# Patient Record
Sex: Male | Born: 1935 | Race: White | Hispanic: No | Smoking: Former smoker
Health system: Southern US, Community
[De-identification: ages and names within clinical notes are randomized; demographics above are authoritative.]

## PROBLEM LIST (undated history)

## (undated) DIAGNOSIS — M1990 Unspecified osteoarthritis, unspecified site: Secondary | ICD-10-CM

## (undated) DIAGNOSIS — I10 Essential (primary) hypertension: Secondary | ICD-10-CM

## (undated) DIAGNOSIS — C6290 Malignant neoplasm of unspecified testis, unspecified whether descended or undescended: Secondary | ICD-10-CM

## (undated) DIAGNOSIS — R2681 Unsteadiness on feet: Secondary | ICD-10-CM

## (undated) DIAGNOSIS — D649 Anemia, unspecified: Secondary | ICD-10-CM

## (undated) DIAGNOSIS — M069 Rheumatoid arthritis, unspecified: Secondary | ICD-10-CM

## (undated) DIAGNOSIS — R0602 Shortness of breath: Secondary | ICD-10-CM

## (undated) DIAGNOSIS — R002 Palpitations: Secondary | ICD-10-CM

## (undated) DIAGNOSIS — K219 Gastro-esophageal reflux disease without esophagitis: Secondary | ICD-10-CM

## (undated) DIAGNOSIS — H3530 Unspecified macular degeneration: Secondary | ICD-10-CM

## (undated) DIAGNOSIS — I2510 Atherosclerotic heart disease of native coronary artery without angina pectoris: Secondary | ICD-10-CM

## (undated) DIAGNOSIS — G20 Parkinson's disease: Secondary | ICD-10-CM

## (undated) DIAGNOSIS — R52 Pain, unspecified: Secondary | ICD-10-CM

## (undated) DIAGNOSIS — G20A1 Parkinson's disease without dyskinesia, without mention of fluctuations: Secondary | ICD-10-CM

## (undated) DIAGNOSIS — H9191 Unspecified hearing loss, right ear: Secondary | ICD-10-CM

## (undated) HISTORY — DX: Unsteadiness on feet: R26.81

## (undated) HISTORY — DX: Palpitations: R00.2

## (undated) HISTORY — PX: OTHER SURGICAL HISTORY: SHX169

## (undated) HISTORY — DX: Atherosclerotic heart disease of native coronary artery without angina pectoris: I25.10

## (undated) HISTORY — PX: APPENDECTOMY: SHX54

## (undated) HISTORY — DX: Unspecified hearing loss, right ear: H91.91

## (undated) HISTORY — DX: Unspecified osteoarthritis, unspecified site: M19.90

## (undated) HISTORY — DX: Parkinson's disease: G20

## (undated) HISTORY — DX: Malignant neoplasm of unspecified testis, unspecified whether descended or undescended: C62.90

## (undated) HISTORY — DX: Rheumatoid arthritis, unspecified: M06.9

## (undated) HISTORY — DX: Unspecified macular degeneration: H35.30

## (undated) HISTORY — DX: Anemia, unspecified: D64.9

## (undated) HISTORY — DX: Essential (primary) hypertension: I10

## (undated) HISTORY — DX: Parkinson's disease without dyskinesia, without mention of fluctuations: G20.A1

## (undated) HISTORY — PX: COLONOSCOPY: SHX174

## (undated) HISTORY — DX: Shortness of breath: R06.02

## (undated) HISTORY — DX: Gastro-esophageal reflux disease without esophagitis: K21.9

## (undated) HISTORY — PX: CARDIAC CATHETERIZATION: SHX172

## (undated) HISTORY — DX: Pain, unspecified: R52

---

## 1982-07-13 HISTORY — PX: OTHER SURGICAL HISTORY: SHX169

## 1994-07-13 HISTORY — PX: OTHER SURGICAL HISTORY: SHX169

## 2007-02-10 ENCOUNTER — Ambulatory Visit (HOSPITAL_COMMUNITY): Admission: RE | Admit: 2007-02-10 | Discharge: 2007-02-10 | Payer: Self-pay | Admitting: Internal Medicine

## 2007-03-31 ENCOUNTER — Encounter: Payer: Self-pay | Admitting: Cardiology

## 2007-04-06 ENCOUNTER — Encounter: Payer: Self-pay | Admitting: Cardiology

## 2007-06-10 ENCOUNTER — Encounter: Payer: Self-pay | Admitting: Cardiology

## 2009-09-09 ENCOUNTER — Encounter: Payer: Self-pay | Admitting: Cardiology

## 2009-10-18 ENCOUNTER — Ambulatory Visit: Payer: Self-pay | Admitting: Cardiology

## 2009-10-18 ENCOUNTER — Encounter: Payer: Self-pay | Admitting: Cardiology

## 2009-10-28 ENCOUNTER — Encounter: Payer: Self-pay | Admitting: Cardiology

## 2009-12-02 ENCOUNTER — Encounter: Payer: Self-pay | Admitting: Cardiology

## 2009-12-03 ENCOUNTER — Ambulatory Visit: Payer: Self-pay | Admitting: Cardiology

## 2009-12-03 DIAGNOSIS — Z8679 Personal history of other diseases of the circulatory system: Secondary | ICD-10-CM | POA: Insufficient documentation

## 2009-12-03 DIAGNOSIS — J4489 Other specified chronic obstructive pulmonary disease: Secondary | ICD-10-CM | POA: Insufficient documentation

## 2009-12-03 DIAGNOSIS — R9439 Abnormal result of other cardiovascular function study: Secondary | ICD-10-CM

## 2009-12-03 DIAGNOSIS — R079 Chest pain, unspecified: Secondary | ICD-10-CM

## 2009-12-03 DIAGNOSIS — R5383 Other fatigue: Secondary | ICD-10-CM

## 2009-12-03 DIAGNOSIS — R5381 Other malaise: Secondary | ICD-10-CM

## 2009-12-03 DIAGNOSIS — D649 Anemia, unspecified: Secondary | ICD-10-CM

## 2009-12-03 DIAGNOSIS — J449 Chronic obstructive pulmonary disease, unspecified: Secondary | ICD-10-CM

## 2009-12-03 DIAGNOSIS — I2510 Atherosclerotic heart disease of native coronary artery without angina pectoris: Secondary | ICD-10-CM | POA: Insufficient documentation

## 2009-12-10 ENCOUNTER — Encounter: Payer: Self-pay | Admitting: Physician Assistant

## 2009-12-31 ENCOUNTER — Encounter: Payer: Self-pay | Admitting: Cardiology

## 2010-01-16 ENCOUNTER — Ambulatory Visit: Payer: Self-pay | Admitting: Cardiology

## 2010-05-06 ENCOUNTER — Encounter: Payer: Self-pay | Admitting: Cardiology

## 2010-05-08 ENCOUNTER — Encounter: Payer: Self-pay | Admitting: Cardiology

## 2010-08-12 NOTE — Assessment & Plan Note (Signed)
Summary: np/ref: shah/ abnormal stress test-[facility]   Visit Type:  Initial Consult Primary Provider:  Dhruv Vyas,MD  CC:  chest pain.  History of Present Illness: The patient is seen for cardiac evaluation.  He is a pleasant active 77 year old male.  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  He has not had syncope or presyncope.  By history he says that he had an MI in 1981 that was treated at [facility].  We do not have any information.  Since that time he has had intermittent sharp chest discomfort.  It does not limit him.  A nuclear stress study was done October 18, 2009.  He walked for 3 minutes.  He had some ST changes with shortness of breath.  Ejection fraction was 54%.  There was no ischemia.  There was question of either inferior attenuation or inferior scar.  The patient also mentions that he has palpitations.  These are not prolonged. He has not had syncope or presyncope.  Preventive Screening-Counseling & Management  Alcohol-Tobacco     Smoking Status: quit     Year Quit: 1963  Current Medications (verified): 1)  Prilosec Otc 20 Mg Tbec (Omeprazole Magnesium) .... Take 1 Tablet By Mouth Once A Day As Needed 2)  Multivitamins  Tabs (Multiple Vitamin) .... Take 1 Tablet By Mouth Once A Day 3)  Potassium Gluconate 595 Mg Tabs (Potassium Gluconate) .... Take 1 Tablet By Mouth Once A Day 4)  Optic-Vites  Tabs (Multiple Vitamins-Minerals) .... Take 1 Tablet By Mouth Once A Day  Allergies (verified): No Known Drug Allergies  Comments:  Nurse/Medical Assistant: The patient's medications and allergies were reviewed with the patient and were updated in the Medication and Allergy Lists. Bottles reviewed.  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 Donovan Kail) Systemic arterial hypertension Chest pains Shortness of Breath Nuclear stress test.... October 18, 2009... treadmill 3 minutes... abnormal STs... short of breath... 54%.... no ischemia.... probable inferior attenuation.( can not r/o/ scar)  Social History: Smoking Status:  quit  Review of Systems       Patient denies fever, chills, headache, sweats, rash, change in vision, change in hearing, cough, nausea vomiting, urinary symptoms. All other systems are reviewed and are negative.  Vital Signs:  Patient profile:   77 year old male Height:      67 inches Weight:      162 pounds BMI:     25.46 Pulse rate:   69 / minute BP sitting:   154 / 82  (left arm) Cuff size:   regular  Vitals Entered By: Carlye Grippe (Dec 03, 2009 3:09 PM)  Nutrition Counseling: Patient's BMI is greater than 25 and therefore counseled on weight management options. CC: chest pain   Physical Exam  General:  The patient is quite stable. Head:  head is atraumatic. Eyes:  no xanthelasma. Neck:  no jugular venous distention.  No carotid bruits. Chest Wall:  no chest wall tenderness. Lungs:  lungs are clear.  Respiratory effort is nonlabored. Heart:  cardiac exam reveals S1 and S2.  No clicks or significant  murmurs. Abdomen:  abdomen is soft. Msk:  no musculoskeletal deformities. Extremities:  no peripheral edema Skin:  no skin rashes. Psych:  patient is oriented to person time and place.  Affect is normal.   Impression & Recommendations:  Problem # 1:  PALPITATIONS, HX OF (ICD-V12.50)  The patient has palpitations.  He has not had syncope or presyncope.  We will arrange 24-hour monitor to be sure that he is not having any significant arrhythmias.  Orders: Holter Monitor (Holter Monitor)  Problem # 2:   CAD (ICD-414.00) There is history of coronary disease.  We do not have the information. Recent EKG from the hospital is reviewed.  There no diagnostic abnormalities.  I did not repeat EKG today.  Problem # 3:  COPD (ICD-496) The patient does have mild COPD.  Pulmonary function studies were warranted 2008 showing mild obstruction.  There is question on one of his reports of the possibility of some asbestos exposure.  Problem # 4:  CHEST PAIN UNSPECIFIED (ICD-786.50) The patient does have chest pain.  I cannot absolutely rule out angina.  His nuclear exercise study did not show any marked ischemia.  He was limited by shortness of breath.  I discussed the issue with him and chosen not to proceed with cardiac catheterization.  However I will see him back for followup to assess him further over time.  Patient Instructions: 1)  FOLLOW UP APPOINTMENT WITH DR. Tamarius Rosenfield ON THURSDAY, January 16, 2010 AT 1PM.  2)  Your physician has recommended that you wear a holter monitor.  Holter monitors are medical devices that record the heart's electrical activity. Doctors most often use these monitors to diagnose arrhythmias. Arrhythmias are problems with the speed or rhythm of the heartbeat. The monitor is a small, portable device. You can wear one while you do your normal daily activities. This is usually used to diagnose what is causing palpitations/syncope (passing out). If the results of your test are normal or stable, you will receive a letter. If they are abnormal, the nurse will contact you by phone.  3)  Your physician recommends that you continue on your current medications as directed. Please refer to the Current Medication list given to you today.

## 2010-08-12 NOTE — Assessment & Plan Note (Signed)
Summary: 1 MONTH F/U PER 5/24 OV-JM   Visit Type:  Follow-up Primary Provider:  Dhruv Vyas,MD  CC:  palpitations.  History of Present Illness: The patient is seen for followup of palpitations.  I saw him last Dec 03, 2009.  I arranged for a 24-hour Holter monitor.  This was done.  Patient had normal sinus rhythm with scattered PVCs.  There were no significant arrhythmias.  Since that time he has had 3 episodes of feeling palpitations.  It always occurs at nighttime.  He has not had any during the day.  He is not having any significant chest pain.  Preventive Screening-Counseling & Management  Alcohol-Tobacco     Smoking Status: quit     Year Quit: 1963  Current Medications (verified): 1)  Prilosec Otc 20 Mg Tbec (Omeprazole Magnesium) .... Take 1 Tablet By Mouth Once A Day As Needed 2)  Multivitamins  Tabs (Multiple Vitamin) .... Take 1 Tablet By Mouth Once A Day 3)  Potassium Gluconate 595 Mg Tabs (Potassium Gluconate) .... Take 1 Tablet By Mouth Once A Day 4)  Optic-Vites  Tabs (Multiple Vitamins-Minerals) .... Take 1 Tablet By Mouth Once A Day  Allergies (verified): No Known Drug Allergies  Comments:  Nurse/Medical Assistant: The patient's medications and allergies were verbally reviewed with the patient and were updated in the Medication and Allergy Lists.  Past History:  Past Medical History: Mild Anemia Coronary artery atherosclerotic heart disease post MI in 1981 (treated at [facility] by Dr. Donovan Kail) Systemic arterial hypertension Chest pains Shortness of Breath Nuclear stress test.... October 18, 2009... treadmill 3 minutes... abnormal STs... short of breath... 54%.... no ischemia.... probable inferior attenuation.( can not r/o/ scar) palpitations.... Holter monitor.. May, 2011... normal sinus rhythm with PVCs  Review of Systems       Patient denies fever, chills, headache, sweats, rash, change in vision, change in hearing, chest pain, cough, nausea vomiting,  urinary symptoms.  He's not sleeping well.  All of the systems are reviewed and are negative.  Vital Signs:  Patient profile:   77 year old male Height:      67 inches Weight:      174 pounds Pulse rate:   77 / minute BP sitting:   149 / 89  (left arm) Cuff size:   regular  Vitals Entered By: Carlye Grippe (January 16, 2010 1:04 PM)  Physical Exam  General:  patient is stable. Eyes:  no xanthelasma. Neck:  no jugular venous distention. Lungs:  lungs are clear.  Respiratory effort is nonlabored. Heart:  cardiac exam reveals S1 and S2.  No clicks or significant murmurs Abdomen:  abdomen is soft. Extremities:  no peripheral edema. Psych:  patient is oriented to person time and place.  Affect is normal.   Impression & Recommendations:  Problem # 1:  PALPITATIONS, HX OF (ICD-V12.50) As outlined in the history of present illness the patient has some palpitations only at nighttime.  I decided not to push for other recording at this time.  He may need a 21 day recorder in the future.  We will try a small dose of beta blocker in the evening and see how he does.  Problem # 2:  CAD (ICD-414.00) Coronary disease is stable.  No further workup at this time.  Appended Document: [facility]     Allergies: No Known Drug Allergies   Patient Instructions: 1)  Follow up with Dr. Myrtis Ser on Friday, March 21, 2010 at 1:45pm. 2)  Start Metoprolol  tart (Lopressor) 25mg  by mouth every evening. Prescriptions: METOPROLOL TARTRATE 25 MG TABS (METOPROLOL TARTRATE) Take one tablet by mouth every evening  #30 x 6   Entered by:   Cyril Loosen, RN, BSN   Authorized by:   Talitha Givens, MD, [facility]   Signed by:   Cyril Loosen, RN, BSN on 01/16/2010   Method used:   Electronically to        [facility]* (retail)       [address]       [place], [place]  [address]       Ph: [phone]       Fax: [phone]   RxID:   [id number]   Handout requested.

## 2010-08-12 NOTE — Procedures (Signed)
Summary: Holter Monitor  Holter Monitor   Imported By: Cyril Loosen, RN, BSN 12/11/2009 14:22:23  _____________________________________________________________________  External Attachment:    Type:   Image     Comment:   External Document  Appended Document: Holter Monitor Left message to notify pt of results.

## 2010-08-12 NOTE — Miscellaneous (Signed)
  Clinical Lists Changes  Observations: Added new observation of CARDIO HPI: Nuclear stress 10/18/2009.Marland KitchenMarland KitchenMarland KitchenTreadmill  3 min...abnormal ST's...SOB...artifact...54%...no ischemia..probable inferior attenuation..(can't r/o/ scar) (12/02/2009 11:28)      History of Present Illness: Nuclear stress 10/18/2009.Marland KitchenMarland KitchenMarland KitchenTreadmill  3 min...abnormal ST's...SOB...artifact...54%...no ischemia..probable inferior attenuation..(can't r/o/ scar)

## 2010-09-12 ENCOUNTER — Telehealth: Payer: Self-pay | Admitting: Cardiology

## 2010-09-12 ENCOUNTER — Encounter: Payer: Self-pay | Admitting: Cardiology

## 2010-09-15 ENCOUNTER — Ambulatory Visit (INDEPENDENT_AMBULATORY_CARE_PROVIDER_SITE_OTHER): Payer: MEDICARE | Admitting: Cardiology

## 2010-09-15 ENCOUNTER — Encounter: Payer: Self-pay | Admitting: Cardiology

## 2010-09-15 ENCOUNTER — Encounter (INDEPENDENT_AMBULATORY_CARE_PROVIDER_SITE_OTHER): Payer: Self-pay | Admitting: *Deleted

## 2010-09-15 DIAGNOSIS — I10 Essential (primary) hypertension: Secondary | ICD-10-CM

## 2010-09-15 DIAGNOSIS — I2510 Atherosclerotic heart disease of native coronary artery without angina pectoris: Secondary | ICD-10-CM

## 2010-09-18 ENCOUNTER — Inpatient Hospital Stay (HOSPITAL_BASED_OUTPATIENT_CLINIC_OR_DEPARTMENT_OTHER)
Admission: RE | Admit: 2010-09-18 | Discharge: 2010-09-18 | Disposition: A | Discharge status: Home / Self Care | Payer: MEDICARE | Source: Ambulatory Visit | Attending: Cardiology | Admitting: Cardiology

## 2010-09-18 DIAGNOSIS — R079 Chest pain, unspecified: Secondary | ICD-10-CM | POA: Insufficient documentation

## 2010-09-18 DIAGNOSIS — I2510 Atherosclerotic heart disease of native coronary artery without angina pectoris: Secondary | ICD-10-CM

## 2010-09-18 NOTE — Letter (Signed)
Summary: External Correspondence/  [facility]  External Correspondence/  [facility]   Imported By: Dorise Hiss 09/12/2010 14:36:34  _____________________________________________________________________  External Attachment:    Type:   Image     Comment:   External Document

## 2010-09-18 NOTE — Progress Notes (Signed)
Summary: Needs ASAP appt Per Dr. Sherril Croon.  Phone Note From Other Clinic Call back at [phone]   Caller: Dennie Bible Summary of Call: Dennie Bible with [facility] called regarding pt. She states Dr. Sherril Croon would like pt seen next week for evaluation and possible cath. She states he was seen in the office for chest pain. She will send office note and EKG to our office today. She is aware a note will be sent to Dr. Myrtis Ser for further recommendation as his first available appt is in April. Initial call taken by: Cyril Loosen, RN, BSN,  September 12, 2010 1:45 PM  Follow-up for Phone Call        Dr. Myrtis Ser had cancellation for Monday, March 5th at 9:15am. Pt was scheduled in this spot and [facility] was notified by Judeth Cornfield.  Follow-up by: Cyril Loosen, RN, BSN,  September 12, 2010 2:36 PM

## 2010-09-23 ENCOUNTER — Encounter: Payer: Self-pay | Admitting: *Deleted

## 2010-09-23 NOTE — Letter (Signed)
Summary: Cardiac Cath Instructions - [facility]  [facility] [address]   [place], [place] [address]   Phone: [phone]  Fax: [phone]     09/15/2010 MRN: [id number]  [place] Pultz [address] [place], [place]  [address]  [place]  Dear Mr. Savoca,   You are scheduled for a Cardiac Catheterization on Thursday, September 18, 2010 with Dr. Antoine Poche.  Please arrive to the 1st floor of the Heart and Vascular Center at [facility] at 9:30 am on the day of your procedure. Please do not arrive before 6:30 a.m. Call the Heart and Vascular Center at [phone] if you are unable to make your appointmnet. The Code to get into the parking garage under the building is 3000. Take the elevators to the 1st floor. You must have someone to drive you home. Someone must be with you for the first 24 hours after you arrive home. Please wear clothes that are easy to get on and off and wear slip-on shoes. Do not eat or drink after midnight except water with your medications that morning. Bring all your medications and current insurance cards with you.  ___ DO NOT take these medications before your procedure:  _X__ Make sure you take your aspirin.  _X__ You may take all of your medications with water that morning.  ___ DO NOT take ANY medications before your procedure.  ___ Pre-med instructions:  ________________________________________________________________________  The usual length of stay after your procedure is 2 to 3 hours. This can vary.  If you have any questions, please call the office at the number listed above.  Cyril Loosen, RN, BSN              Directions to the [facility]  Please Note : Park in [place] under the building not the parking deck.  From [employer]: Turn onto [address] Left onto [address] (1st stoplight) Right at the brick entrance to the hospital (Main circle  drive) Bear to the right and you will see a blue sign "Heart and Vascular Center" Parking garage is a sharp right'to get through the gate out in the code _3000______. Once you park, take the elevator to the first floor. Please do not arrive before 0630am. The building will be dark before that time.   From [address] Turn onto [address] Turn left into the brick entrance to the hospital (Main circle drive) [place] to the right and you will see a blue sign "[facility] Center" Parking garage is a sharp right, to get thru the gate put in the code _3000___. Once you park, take the elevator to the first floor. Please do not arrive before 0630am. The building will be dark before that time

## 2010-09-23 NOTE — Assessment & Plan Note (Signed)
Summary: Possible Cath - Chest Pain  - [facility]   Visit Type:  Add-on for chest pain Primary Provider:  Dhruv Vyas,MD  CC:  CAD.  History of Present Illness: Clifford [name] is seen today as an add-on for evaluation of chest pain.  I saw him last July, 2011.  He has known coronary artery disease with an MI in 22.  This was evaluated elsewhere.  He had a stress nuclear scan in April, 2011.  At that time he had shortness of breath and some ST changes but no definite evidence of ischemia.  Last week he had some anterior chest discomfort that went through Clifford shoulder.  He had persistent left arm discomfort.  He also has had some pain in Clifford right side of his neck.  He saw Dr.Vyas who wanted him in Clifford hospital.  Clifford [name] did not want to be in Clifford hospital and agreed to start taking Imdur.  Arrangement was made for me to see him in Clifford office today.  He has not had any significant recurrent chest pain, but he describes some ongoing tightness that can be with him for several hours.  EKG done with his primary physician did not show any acute changes.  He has not had any nausea vomiting or diaphoresis.  He has had some mild shortness of breath. add  Preventive Screening-Counseling & Management  Alcohol-Tobacco     Smoking Status: quit     Year Quit: 1963  Current Medications (verified): 1)  Omeprazole 40 Mg Cpdr (Omeprazole) .... Take 1 Tablet By Mouth Once A Day 2)  Multivitamins  Tabs (Multiple Vitamin) .... Take 1 Tablet By Mouth Once A Day 3)  Potassium Gluconate 595 Mg Tabs (Potassium Gluconate) .... Take 1 Tablet By Mouth Once A Day 4)  Optic-Vites  Tabs (Multiple Vitamins-Minerals) .... Take 1 Tablet By Mouth Once A Day 5)  Nitrostat 0.4 Mg Subl (Nitroglycerin) .Marland Kitchen.. 1 Under Tongue @ Onset of Chest Pain,you May Repeat Every 5 Min. For Up To 3 Doses. If Chest Pain Continues @3rd  Dose,call 911 & Proceed To Er 6)  Meloxicam 7.5 Mg Tabs (Meloxicam) .... Take 1 Tablet By Mouth Two Times A Day As  Needed 7)  Isosorbide Mononitrate Cr 30 Mg Xr24h-Tab (Isosorbide Mononitrate) .... Take 1 Tablet By Mouth Once A Day  Allergies (verified): No Known Drug Allergies  Comments:  Nurse/Medical Assistant: Clifford [name]'s medication bottles and allergies were reviewed with Clifford [name] and were updated in Clifford Medication and Allergy Lists.  Past History:  Past Medical History: Anemia   mild CAD      post MI in 1981 (treated at [facility] by Dr. Donovan Kail)  /  nuclear stress... April, 2011... no ischemia  /   recurrent chest discomfort March, 2011 Hypertension Shortness of Breath Nuclear stress test.... October 18, 2009... treadmill 3 minutes... abnormal STs... short of breath... 54%.... no ischemia.... probable inferior attenuation.( can not r/o/ scar) palpitations.... Holter monitor.. May, 2011... normal sinus rhythm with PVCs  Review of Systems       [name] denies fever, chills, headache, sweats, rash, change in vision, change in hearing, nausea vomiting, urinary symptoms.  All other systems are reviewed and are negative.  Vital Signs:  [name] profile:   77 year old male Height:      67 inches Weight:      169 pounds BMI:     26.56 Pulse rate:   76 / minute BP sitting:   132 / 69  (left arm)  Cuff size:   regular  Vitals Entered By: Carlye Grippe (September 15, 2010 9:06 AM)  Nutrition Counseling: [name]'s BMI is greater than 25 and therefore counseled on weight management options. CC: CAD   Physical Exam  General:  Clifford [name] is stable today. Head:  head is atraumatic. Eyes:  no xanthelasma. Neck:  no jugular venous extension. Chest Wall:  no chest wall tenderness. Lungs:  lungs reveal a few scattered rhonchi.  There is no respiratory distress. Heart:  cardiac exam reveals S1 and S2.  There no clicks or significant murmurs. Abdomen:  abdomen is soft. Msk:  no musculoskeletal deformities. Extremities:  no peripheral edema. Skin:  no skin rashes. Psych:  [name] is  oriented to person time and place.  Affect is normal.  He is here with a friend today.   Impression & Recommendations:  Problem # 1:  PALPITATIONS, HX OF (ICD-V12.50) Clifford [name] has not had any significant palpitations.  No further workup.  Problem # 2:  COPD (ICD-496) His COPD he does not appear to be a significant problem at this time.  Problem # 3:  ANEMIA, MILD (ICD-285.9) There is a history of anemia.  Hemoglobin will be checked.  Problem # 4:  CAD (ICD-414.00)  Clifford [name] has known coronary artery disease.  There is history of an MI in 1981 treated elsewhere in Clifford past.  He has had return of chest discomfort which is concerning.  He has had intermittent pain since last week.  His EKGs have not shown any changes.  He feels better with Clifford addition of Imdur.  I had long discussion with Clifford [name] about Clifford approach to his care.  I told him that catheterization would be Clifford best way to obtain further information.  He was hesitant at first and we discussed fully all of Clifford options.  He then decided that he would like to proceed with catheterization if it can be done as an outpatient.  This can be arranged.  We will check his hemoglobin checked CPK and troponins.  Renal function of course will be checked.  We will arrange for outpatient catheterization.   Orders: Cardiac Catheterization (Cardiac Cath)  Problem # 5:  HYPERTENSION (ICD-401.9) Blood pressure control today.  No change in therapy.  Other Orders: EKG w/ Interpretation (93000) T-Chest x-ray, 2 views (14782) T-Basic Metabolic Panel (95621-30865) T-CBC No Diff ([id number]) T-Protime, Auto ([id number]) T-PTT ([id number])  [name] Instructions: 1)  Your physician recommends that you go to [facility] for lab work and a chest x-ray: DO TODAY. 2)  Your physician has requested that you have a cardiac catheterization.  Cardiac catheterization is used to diagnose and/or treat various heart conditions. Doctors may  recommend this procedure for a number of different reasons. Clifford most common reason is to evaluate chest pain. Chest pain can be a symptom of coronary artery disease (CAD), and cardiac catheterization can show whether plaque is narrowing or blocking your heart's arteries. This procedure is also used to evaluate Clifford valves, as well as measure Clifford blood flow and oxygen levels in different parts of your heart.  For further information please visit [email or web address]  Please follow instruction sheet, as given.  Prevention & Chronic Care Immunizations   Influenza vaccine: Not documented    Tetanus booster: Not documented    Pneumococcal vaccine: Not documented    H. zoster vaccine: Not documented  Colorectal Screening   Hemoccult: Not documented    Colonoscopy: Not documented  Other Screening  PSA: Not documented   Smoking status: quit  (09/15/2010)  Lipids   Total Cholesterol: Not documented   LDL: Not documented   LDL Direct: Not documented   HDL: Not documented   Triglycerides: Not documented  Hypertension   Last Blood Pressure: 132 / 69  (09/15/2010)   Serum creatinine: Not documented   Serum potassium Not documented  Self-Management Support :    Hypertension self-management support: Not documented

## 2010-09-25 NOTE — Procedures (Signed)
  NAMEMARCH, STEYER NO.:  [id number]  MEDICAL RECORD NO.:  [id number]           PATIENT TYPE:  LOCATION:                                 FACILITY:  PHYSICIAN:  Rollene Rotunda, MD, FACCDATE OF BIRTH:  Feb 24, 1932  DATE OF PROCEDURE:  09/18/2010 DATE OF DISCHARGE:                           CARDIAC CATHETERIZATION   PRIMARY CARE PHYSICIAN:  Doreen Beam, MD  CARDIOLOGIST:  Luis Abed, MD, FACC  OPERATOR:  Rollene Rotunda, MD, [facility]  PROCEDURE:  Cardiac catheterization.  REASON FOR PROCEDURE:  Evaluate the patient with known coronary disease who presents with chest pain consistent with unstable angina.  PROCEDURE NOTE:  Left heart catheterization performed via right femoral artery.  The artery was cannulated using the anterior wall puncture.  A #5 French arterial sheath was inserted via the modified Seldinger technique.  Preformed Judkins and pigtail catheters were utilized.  The patient tolerated the procedure well and left the lab in stable condition.  RESULTS:  Hemodynamics:  LV 128/17, AO 119/80.  Coronaries:  Left main had 25% stenosis.  The LAD had proximal long 25% stenosis and mid 40% stenosis.  First diagonal was small with ostial 80% stenosis.  Second diagonal was small with ostial 80% stenosis. Circumflex in the AV groove was small.  There were luminal irregularities.  First obtuse marginal was large with long proximal 25% stenosis.  The superior branch had 30-40% stenosis, and an inferior branch had tandem 25% lesions.  The right coronary artery is a large dominant vessel.  There were diffuse scattered 25-30% lesions.  The PDA was free of high-grade disease.  Left ventriculogram:  The left ventriculogram was obtained in the RAO projection.  The EF was 60%.  CONCLUSIONS:  Small vessel disease particularly diagonals and nonobstructive disease elsewhere.  Well-preserved ejection fraction.  PLAN:  The patient will continue aggressive  medical management.     Rollene Rotunda, MD, [facility]     JH/MEDQ  D:  09/18/2010  T:  09/19/2010  Job:  [id number]  cc:   Luis Abed, MD, [facility]  Electronically Signed by Rollene Rotunda MD [facility] on 09/25/2010 08:00:53 PM

## 2010-09-30 NOTE — Letter (Signed)
Summary: Internal Correspondence/  FAXED PRE-CATH ORDERS  Internal Correspondence/  FAXED PRE-CATH ORDERS   Imported By: Dorise Hiss 09/22/2010 16:03:39  _____________________________________________________________________  External Attachment:    Type:   Image     Comment:   External Document

## 2010-10-20 ENCOUNTER — Encounter: Payer: Self-pay | Admitting: Cardiology

## 2010-10-20 ENCOUNTER — Ambulatory Visit (INDEPENDENT_AMBULATORY_CARE_PROVIDER_SITE_OTHER): Payer: MEDICARE | Admitting: Cardiology

## 2010-10-20 DIAGNOSIS — J449 Chronic obstructive pulmonary disease, unspecified: Secondary | ICD-10-CM

## 2010-10-20 DIAGNOSIS — I10 Essential (primary) hypertension: Secondary | ICD-10-CM

## 2010-10-20 DIAGNOSIS — R002 Palpitations: Secondary | ICD-10-CM | POA: Insufficient documentation

## 2010-10-20 DIAGNOSIS — I2510 Atherosclerotic heart disease of native coronary artery without angina pectoris: Secondary | ICD-10-CM | POA: Insufficient documentation

## 2010-10-20 DIAGNOSIS — R0602 Shortness of breath: Secondary | ICD-10-CM | POA: Insufficient documentation

## 2010-10-20 MED ORDER — ASPIRIN EC 81 MG PO TBEC: 81.0000 mg | DELAYED_RELEASE_TABLET | Freq: Every day | ORAL | Status: AC

## 2010-10-20 NOTE — Patient Instructions (Signed)
Your physician you to follow up in 1 year. You will receive a reminder letter in the mail one-two months in advance. If you don't receive a letter, please call our office to schedule the follow-up appointment. Resume Aspirin 81mg  by mouth once daily.

## 2010-10-20 NOTE — Assessment & Plan Note (Signed)
His repeat catheterization did show mild coronary disease.  He does have an 80% stenosis of a very small diagonal that is to be treated medically.  He is stable and will go back to full activities.  He will resume taking aspirin as this is very important.  I will leave the re\re addition of other medications to his primary team.  Optimally he would be on a statin.

## 2010-10-20 NOTE — Assessment & Plan Note (Signed)
Systolic blood pressure is elevated today.  He was not elevated in the catheter lab.  He will need blood pressure followup.

## 2010-10-20 NOTE — Assessment & Plan Note (Signed)
He had a recent respiratory illness and was treated as an outpatient by his primary team.  No further workup.
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
## 2011-11-24 ENCOUNTER — Encounter (HOSPITAL_COMMUNITY): Payer: Medicare Other | Attending: Oncology

## 2011-11-24 ENCOUNTER — Encounter (HOSPITAL_COMMUNITY): Payer: Self-pay

## 2011-11-24 VITALS — BP 156/66 | HR 55 | Temp 97.7°F | Ht 67.0 in | Wt 168.0 lb

## 2011-11-24 DIAGNOSIS — N508 Other specified disorders of male genital organs: Secondary | ICD-10-CM

## 2011-11-24 DIAGNOSIS — N5089 Other specified disorders of the male genital organs: Secondary | ICD-10-CM

## 2011-11-24 DIAGNOSIS — D649 Anemia, unspecified: Secondary | ICD-10-CM

## 2011-11-24 NOTE — Progress Notes (Signed)
[facility]  Telephone:([phone]) [phone] Fax:([phone]   MEDICAL ONCOLOGY - INITIAL CONSULATION   Referral MD:  Dr. Alleen Borne, M.D.  Reason for Referral:  Right testicular mass.   HPI:  Mr. Clifford [name] is a 78 year-old man with 3 month history of right testicular mass.  He was evaluated with a scrotal US on 10/12/2011 which showed abnormal echotexture to the bilateral tescicles which were replaced by masses. CT abdomen/pelvis on 11/02/2011 was negative for adenopathy, cirrhosis, abnormal mass.  He was evaluated by Dr. Jerre Simon who kindly referred the patient to [facility] for [facility].  His lab on 11/09/2011 showed WBC 4.9; Hgb 10.5; Plt 180; AFP 2ng/mL (ref <8 ng/mL); LDH 159 u/L (ref 94-250); beta hCG <0.5 mIU/mL (ref <5).   I evaluated the patient today in Dr. Minerva Areola Neijstrom's absence.    Clifford [name] sustained left testicular crush trauma from a baseball when he was 78 year-old.  He did not have left orchiectomy; instead, the left testis had been involuting compared to the right testis.  He has been in usual state of health until about 3 months ago when he developed progressive swelling of the right testicle.  It has been moderately painful especially when he sits of lies down the wrong way.  The pain is described as sharp and crampy.  The pain radiates to the right groin; however, he does not notice much discrete swelling of the right groin.  Despite the pain, he has not been taking pain med.  He denies hematuria, penile discharge, pyuria, dysuria.  Patient denies fatigue, fever, anorexia, weight loss, headache, visual changes, confusion, drenching night sweats, palpable lymph node swelling, mucositis, odynophagia, dysphagia, nausea vomiting, jaundice, chest pain, palpitation, shortness of breath, dyspnea on exertion, productive cough, gum bleeding, epistaxis, hematemesis, hemoptysis, abdominal pain, melena, hematochezia, hematuria, skin rash, spontaneous  bleeding, joint swelling, joint pain, heat or cold intolerance, bowel bladder incontinence, back pain, focal motor weakness, paresthesia, depression, suicidal or homocidal ideation, feeling hopelessness.     Past Medical History  Diagnosis Date  . Anemia     mild   . CAD (coronary artery disease)     Catheterization September 18, 2010, EF 60%, small vessel disease and nonobstructive disease elsewhere, medical therapy /      post mi in 1981 (treated in [facility] by Dr.zarotti)/nuclear stress ...april 2011...no ischemia/recurrent chest discomfort  march 2011  . Hypertension   . SOB (shortness of breath)   . Palpitation     holter moniter..may 2011...normal sinus rhythum PVC's  . Arthritis   . GERD (gastroesophageal reflux disease)   :  Past Surgical History  Procedure Date  . Hemmorroid surgery 1984  . Appendectomy   . Shoulder operation 1996  . Cardiac catheterization     X 2  . Colonoscopy 2008    reportedly negative; next due in 2018.   :  Current Outpatient Prescriptions  Medication Sig Dispense Refill  . Multiple Vitamins-Minerals (MULTIVITAMIN WITH MINERALS) tablet Take 1 tablet by mouth daily.        . Multiple Vitamins-Minerals (OPTIC-VITES PO) Take 1 tablet by mouth daily.        Marland Kitchen omeprazole (PRILOSEC) 40 MG capsule Take 40 mg by mouth daily.        . Potassium Gluconate 595 MG CAPS Take 1 capsule by mouth daily.        . isosorbide mononitrate (IMDUR) 30 MG 24 hr tablet Take 30 mg by mouth daily.        Marland Kitchen  meloxicam (MOBIC) 7.5 MG tablet Take 7.5 mg by mouth 2 (two) times daily.        . nitroGLYCERIN (NITROSTAT) 0.4 MG SL tablet Place 0.4 mg under the tongue every 5 (five) minutes as needed.           No Known Allergies:  Family History  Problem Relation Age of Onset  . Heart disease Mother   . Lung disease Father     from coal mine exposure  :  History   Social History  . Marital Status: Widowed    Spouse Name: N/A    Number of Children: 4  . Years  of Education: N/A   Occupational History  .      retired Arts administrator   Social History Main Topics  . Smoking status: Former Smoker -- 1.0 packs/day for 20 years    Quit date: 03/28/1962  . Smokeless tobacco: Not on file  . Alcohol Use: No  . Drug Use: No  . Sexually Active: Not on file   Other Topics Concern  . Not on file   Social History Narrative  . No narrative on file  :  Pertinent items are noted in HPI.  Exam: ECOG 1  General:  well-nourished man, in no acute distress.  Eyes:  no scleral icterus.  ENT:  There were no oropharyngeal lesions.  Neck was without thyromegaly.  Lymphatics:  Negative cervical, supraclavicular or axillary adenopathy.  There was slight enlargement of borderline right inguinal adenopathy.  Respiratory: lungs were clear bilaterally without wheezing or crackles.  Cardiovascular:  Regular rate and rhythm, S1/S2, without murmur, rub or gallop.  There was 1+ pedal edema (right more than left).  GI:  abdomen was soft, mildly distended, no clear fluid wave, nontender, without organomegaly.  GU exam showed fibrotic left testis without discernable mass or pain.  However, the right testicle was enlarged to about 8cm with rock hard right testicle.  There was mild tender to palpation of the right testis but not the right epididymis.  There was no redness or open wound of the scrotum.  Muscoloskeletal:  no spinal tenderness of palpation of vertebral spine.  Skin exam was without echymosis, petichae.  Neuro exam was nonfocal.  Patient was able to get on and off exam table without assistance.  Gait was normal.  Patient was alerted and oriented.  Attention was good.   Language was appropriate.  Mood was normal without depression.  Speech was not pressured.  Thought content was not tangential.     Assessment and Plan:   1.  Right testicular swelling with US showing bilateral testicular masses:  - Differential:  Lymphoma (normal LDH does not rule out lymphoma); germ  cell tumor (less likely given his age; however, normal AFP and beta hCG do not rule out germ cell tumor either).  There is low clinical concern for infection given lack of fever, leukocytosis and pain of epididymus on exam.    - Work up:  As patient is symptomatic with pain with sitting and lying down, and that this has been progressive over the past 4 months without improvement, I recommended patient be evaluated by Dr. Jerre Simon for possible bilateral orchiectomies for both diagnostic and therapeutic purposes.   - Treatment:  I discussed with Mr. Dirr that if pathology turns out to be malignant (either lymphoma or germ cell tumor), he may need further chemotherapy  2.  Slight anemia:  Patient deferred blood draw today.  In the future, if worsens,  will consider further work up.  He said that his last colonoscopy in 2008 was completely negative.   3.  Follow up:  In about 6-8 wks to follow up with path result in case it is malignant.  This appointment may be canceled if negative pathology.    Thank you for this referral.   The length of time of the face-to-face encounter was 45 minutes. More than 50% of time was spent counseling and coordination of care.

## 2011-12-09 ENCOUNTER — Telehealth (HOSPITAL_COMMUNITY): Payer: Self-pay | Admitting: *Deleted

## 2012-01-05 ENCOUNTER — Ambulatory Visit (HOSPITAL_COMMUNITY): Payer: Medicare Other | Admitting: Oncology

## 2012-01-08 NOTE — Progress Notes (Signed)
Received office notes/labs from [facility]; forwarded from Dr. Gaylyn Rong.

## 2012-01-11 ENCOUNTER — Other Ambulatory Visit: Payer: Self-pay | Admitting: Oncology

## 2012-01-13 ENCOUNTER — Telehealth: Payer: Self-pay | Admitting: Oncology

## 2012-01-13 NOTE — Telephone Encounter (Signed)
I called and talked with Dr. Thornton Papas nurse Ms. Tammy today.  Patient underwent orchiectomy from outside facility with pathology consistent with Leydig cell tumor.  His urologist requested that patient follows up at [facility].  I requested Tammy to arrange for follow up with Dr. Mariel Sleet within 2-3 weeks.

## 2012-01-15 ENCOUNTER — Telehealth: Payer: Self-pay | Admitting: Oncology

## 2012-01-15 NOTE — Telephone Encounter (Signed)
lmonvm for reception @ [facility] re pt needing appt w/Dr. Alvy Bimler per 7/1 pof from [facility]. Per [facility] pt seen by him in Dr. Latrelle Dodrill absence and needs to f/u w/Dr. Mariel Sleet within 2-3 wks.

## 2012-01-18 ENCOUNTER — Telehealth: Payer: Self-pay | Admitting: Oncology

## 2012-01-18 NOTE — Telephone Encounter (Signed)
lmonvm for Mazie @ [facility] re pt needing an appt w/Dr. Mariel Sleet within 2-3. Per 7/1 pof pt seen by [facility] in Dr. Thornton Papas absence and needs to see Dr. Mariel Sleet within 2-3 wks.

## 2012-01-25 ENCOUNTER — Ambulatory Visit (HOSPITAL_COMMUNITY): Payer: Medicare Other | Admitting: Oncology

## 2012-12-19 DIAGNOSIS — M549 Dorsalgia, unspecified: Secondary | ICD-10-CM

## 2013-07-13 HISTORY — PX: ESOPHAGOGASTRODUODENOSCOPY: SHX1529

## 2016-01-15 ENCOUNTER — Encounter: Payer: Self-pay | Admitting: Neurology

## 2016-01-15 ENCOUNTER — Ambulatory Visit (INDEPENDENT_AMBULATORY_CARE_PROVIDER_SITE_OTHER): Payer: Medicare Other | Admitting: Neurology

## 2016-01-15 VITALS — BP 161/80 | HR 65 | Ht 65.5 in | Wt 159.5 lb

## 2016-01-15 DIAGNOSIS — R269 Unspecified abnormalities of gait and mobility: Secondary | ICD-10-CM

## 2016-01-15 DIAGNOSIS — R259 Unspecified abnormal involuntary movements: Secondary | ICD-10-CM

## 2016-01-15 DIAGNOSIS — R29818 Other symptoms and signs involving the nervous system: Secondary | ICD-10-CM | POA: Insufficient documentation

## 2016-01-15 MED ORDER — CARBIDOPA-LEVODOPA 25-100 MG PO TABS: 1.0000 | ORAL_TABLET | Freq: Three times a day (TID) | ORAL | Status: AC
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
## 2016-01-16 ENCOUNTER — Telehealth: Payer: Self-pay | Admitting: *Deleted

## 2016-01-16 ENCOUNTER — Encounter: Payer: Self-pay | Admitting: *Deleted

## 2016-01-16 LAB — COMPREHENSIVE METABOLIC PANEL
ALBUMIN: 4.5 g/dL (ref 3.5–4.7)
ALT: 15 IU/L (ref 0–44)
AST: 25 IU/L (ref 0–40)
Albumin/Globulin Ratio: 2 (ref 1.2–2.2)
Alkaline Phosphatase: 66 IU/L (ref 39–117)
BUN/Creatinine Ratio: 10 (ref 10–24)
BUN: 12 mg/dL (ref 8–27)
Bilirubin Total: 0.2 mg/dL (ref 0.0–1.2)
CO2: 26 mmol/L (ref 18–29)
CREATININE: 1.18 mg/dL (ref 0.76–1.27)
Calcium: 9.1 mg/dL (ref 8.6–10.2)
Chloride: 98 mmol/L (ref 96–106)
GFR calc Af Amer: 67 mL/min/{1.73_m2} (ref >59)
GFR, EST NON AFRICAN AMERICAN: 58 mL/min/{1.73_m2} — AB (ref >59)
GLOBULIN, TOTAL: 2.3 g/dL (ref 1.5–4.5)
Glucose: 91 mg/dL (ref 65–99)
Potassium: 4.6 mmol/L (ref 3.5–5.2)
SODIUM: 139 mmol/L (ref 134–144)
Total Protein: 6.8 g/dL (ref 6.0–8.5)

## 2016-01-16 LAB — C-REACTIVE PROTEIN: CRP: 0.3 mg/L (ref 0.0–4.9)

## 2016-01-16 LAB — FOLATE: Folate: >20 ng/mL (ref >3.0)

## 2016-01-16 LAB — CBC
HEMATOCRIT: 32.3 % — AB (ref 37.5–51.0)
HEMOGLOBIN: 9.8 g/dL — AB (ref 12.6–17.7)
MCH: 23.5 pg — ABNORMAL LOW (ref 26.6–33.0)
MCHC: 30.3 g/dL — AB (ref 31.5–35.7)
MCV: 78 fL — ABNORMAL LOW (ref 79–97)
Platelets: 230 10*3/uL (ref 150–379)
RBC: 4.17 x10E6/uL (ref 4.14–5.80)
RDW: 16.5 % — ABNORMAL HIGH (ref 12.3–15.4)
WBC: 6.2 10*3/uL (ref 3.4–10.8)

## 2016-01-16 LAB — SEDIMENTATION RATE: SED RATE: 11 mm/h (ref 0–30)

## 2016-01-16 LAB — CK: CK TOTAL: 204 U/L (ref 24–204)

## 2016-01-16 LAB — RPR: RPR: NONREACTIVE

## 2016-01-16 LAB — VITAMIN D 25 HYDROXY (VIT D DEFICIENCY, FRACTURES): Vit D, 25-Hydroxy: 26 ng/mL — ABNORMAL LOW (ref 30.0–100.0)

## 2016-01-16 LAB — VITAMIN B12: Vitamin B-12: 750 pg/mL (ref 211–946)

## 2016-01-16 LAB — TSH: TSH: 3.19 u[IU]/mL (ref 0.450–4.500)

## 2016-01-16 NOTE — Telephone Encounter (Signed)
-----   Message from Marcial Pacas, MD sent at 01/16/2016  7:46 AM EDT ----- Please call patient, laboratory showed evidence of microcytic anemia, with hemoglobin 9.8, this pattern is usually associated with blood loss, he should contact his primary care physician for potential evaluations. There is also evidence of mildly low vitamin D 26 ng/ mL, he should take over-the-counter vitamin D 3 supplement 1000 units daily.

## 2016-01-16 NOTE — Telephone Encounter (Signed)
Patient is returning a call. °

## 2016-01-16 NOTE — Telephone Encounter (Signed)
Spoke to patient and his daughter, [name] (on HIPPA) - they are aware of results.  He will start the recommended supplement and follow up with his PCP.

## 2016-01-28 ENCOUNTER — Telehealth: Payer: Self-pay | Admitting: Neurology

## 2016-01-28 ENCOUNTER — Ambulatory Visit
Admission: RE | Admit: 2016-01-28 | Discharge: 2016-01-28 | Disposition: A | Discharge status: Home / Self Care | Payer: Medicare Other | Source: Ambulatory Visit | Attending: Neurology | Admitting: Neurology

## 2016-01-28 DIAGNOSIS — M25559 Pain in unspecified hip: Secondary | ICD-10-CM

## 2016-01-28 DIAGNOSIS — R269 Unspecified abnormalities of gait and mobility: Secondary | ICD-10-CM

## 2016-01-28 DIAGNOSIS — R259 Unspecified abnormal involuntary movements: Secondary | ICD-10-CM

## 2016-01-28 NOTE — Telephone Encounter (Signed)
Spoke to patient - he is aware of results and will keep his follow up to further discuss.

## 2016-01-28 NOTE — Telephone Encounter (Signed)
Please call patient, MRI of the brain showed evidence of chronic changes, mild cortical atrophy, evidence of periventricular small vessel disease, related to aging,  I will review MRI findings at his next follow-up visit   IMPRESSION: This MRI of the brain without contrast shows the following: 1. Moderate extent of T2/FLAIR hyperintense foci in the hemispheres with confluencies in the periatrial white matter bilaterally. This is most consistent with chronic microvascular ischemic changes related to aging.  2. Mild cortical atrophy. 3. The A2 segment of the right ACA is widened. That could be idiopathic or due to a fusiform aneurysm. If clinically indicated consider MRA or other study. 4. There are no acute findings.

## 2016-01-30 NOTE — Telephone Encounter (Signed)
Please advise to see PCP for HIP PAIN, or orthopedist i one has been established. CD

## 2016-01-30 NOTE — Telephone Encounter (Signed)
I called pt's daughter. No answer, left a message asking her to call me back. (Pt's daughter called wanting an MRI of pt's spine/hip)  I called pt. He says that his back/hips/knees are causing him a lot of pain and he can barely move and was wondering if anything further can be done to help him. He is just taking aleve.   I advised pt that Dr. Krista Blue is out of the office for the next 3 weeks and I would send this request to the [facility]. Pt verbalized understanding.

## 2016-01-30 NOTE — Telephone Encounter (Signed)
I called pt's daughter, [name], again. No answer.  I called pt. Pt answered the phone and I advised him that Dr. Brett Fairy recommends that he see his PCP and orthopedic doctor (if one has been established.) Pt verbalized understanding and then passed the phone to his son and asked me to relay the same message.  I spoke to pt's son, [name], and advised him that pt should see his PCP and orthopedic doctor (if one has been established). Pt's son verbalized understanding, but says that he will call [name] and ask her to give [place] a call and discuss further because "we want to figure out why his is like this".

## 2016-01-30 NOTE — Telephone Encounter (Addendum)
pts daughter returned call, I skyped nurse who took the call.

## 2016-01-30 NOTE — Telephone Encounter (Addendum)
Pt's daughter is wanting to know if a MRI of hip/ spine. She says pt is in a lot of pain. Please call and advise [phone] or [phone]

## 2016-01-30 NOTE — Telephone Encounter (Signed)
I spoke to pt's daughter, [name], per [facility]. I advised her that Dr. Brett Fairy recommended that pt see PCP or orthopedist. Pt's daughter says that pt's PCP will not help them and wants something sent in for pt's pain. Pt's daughter, meanwhile, will search for an orthopedic doctor. Pt is only taking aleve for pain. Pt's daughter reports that Dr. Krista Blue wanted to do MRI brain first, and then order an MRI hip/spine if needed. I advised pt's daughter that Dr. Krista Blue will be out of the office for the next 3 weeks and I will have to again send this message to the [facility]. Pt's verbalized understanding.  Dr. Brett Fairy, do you recommend anything for pain for this pt? He is only taking aleve.

## 2016-01-31 NOTE — Telephone Encounter (Signed)
This needs to be addressed with dr Woody Seller - I will ask you to forward my note and phone response to him. CD

## 2016-01-31 NOTE — Telephone Encounter (Signed)
I called and spoke to daughter, [name] and relayed that Dr. Brett Fairy states needs to be addressed with Dr. Woody Seller as per below.   I faxed ofv note and phone note to [phone] Dr. Woody Seller office.  Daughter asking about referral to ortho as well.

## 2016-02-03 ENCOUNTER — Telehealth: Payer: Self-pay | Admitting: Neurology

## 2016-02-03 MED ORDER — TRAMADOL HCL 50 MG PO TABS: 50.0000 mg | ORAL_TABLET | Freq: Four times a day (QID) | ORAL | 1 refills | Status: AC | PRN

## 2016-02-03 NOTE — Telephone Encounter (Signed)
I spoke to pt's daughter, Clifford [name], per [facility]. I advised her that Dr. Brett Fairy ordered tramadol for the pt and it has been sent to [facility]. There has been referral placed to orthopedics as well. Pt's daughter verbalized understanding.  I advised pt that she should advise Dr. Woody Seller that Dr. Brett Fairy gave pt tramadol. Pt's daughter said that she will call his office and advise him of this because he has also placed pt on prednisone.  I will ask our referrals coordinator to call pt's daughter and discuss referral to orthopedics. Please call Clifford [name] at [phone].

## 2016-02-03 NOTE — Telephone Encounter (Signed)
Spoke to [employer] (dgt on HIPPA) - she is aware of orders below.

## 2016-02-03 NOTE — Addendum Note (Signed)
Addended by: Lester Dent A on: 02/03/2016 10:23 AM   Modules accepted: Orders

## 2016-02-03 NOTE — Telephone Encounter (Signed)
I called pt's daughter, Suszanne Finch, (per [facility]). No answer, left a message asking her to call me back.  Dr. Brett Fairy has prescribed tramadol for pt and placed an orthopedic referral. Pt should still see Dr. Woody Seller.  Faxed tramadol RX to [facility]. Received a receipt of confirmation.

## 2016-02-03 NOTE — Telephone Encounter (Signed)
Do you wish to prescribe pain medications for this pt and provide an ortho referral per pt's daughter's request?

## 2016-02-03 NOTE — Telephone Encounter (Signed)
Patient's daughter is calling back.  Please call [phone]

## 2016-02-03 NOTE — Telephone Encounter (Signed)
This is a all of her primary care physician. Referral to orthopedist, and pain medication in the meanwhile.CD

## 2016-02-03 NOTE — Addendum Note (Signed)
Addended by: Larey Seat on: 02/03/2016 10:04 AM   Modules accepted: Orders

## 2016-03-02 ENCOUNTER — Ambulatory Visit: Payer: Medicare Other | Admitting: Neurology

## 2016-03-03 ENCOUNTER — Other Ambulatory Visit: Payer: Self-pay | Admitting: Sports Medicine

## 2016-03-03 DIAGNOSIS — M5416 Radiculopathy, lumbar region: Secondary | ICD-10-CM

## 2016-03-11 ENCOUNTER — Ambulatory Visit
Admission: RE | Admit: 2016-03-11 | Discharge: 2016-03-11 | Disposition: A | Discharge status: Home / Self Care | Payer: Medicare Other | Source: Ambulatory Visit | Attending: Sports Medicine | Admitting: Sports Medicine

## 2016-03-11 DIAGNOSIS — M5416 Radiculopathy, lumbar region: Secondary | ICD-10-CM

## 2016-03-11 MED ORDER — IOPAMIDOL (ISOVUE-M 200) INJECTION 41%
1.0000 mL | Freq: Once | INTRAMUSCULAR | Status: AC
  Administered 2016-03-11: 1 mL via EPIDURAL

## 2016-03-11 MED ORDER — METHYLPREDNISOLONE ACETATE 40 MG/ML INJ SUSP (RADIOLOG
120.0000 mg | Freq: Once | INTRAMUSCULAR | Status: AC
  Administered 2016-03-11: 120 mg via EPIDURAL

## 2016-03-11 NOTE — Discharge Instructions (Signed)

## 2016-12-10 ENCOUNTER — Telehealth: Payer: Self-pay | Admitting: Neurology

## 2016-12-10 NOTE — Telephone Encounter (Signed)
Patients daughter called office in reference to a back injection patient had done last year at a facility in [place], but not sure of the name of it.  Daughter would like to know if patient can have another one scheduled or if he needs to come in to be seen before.  Please call

## 2016-12-10 NOTE — Telephone Encounter (Addendum)
He was evaluated for his back/hip pain at [facility].  They ordered his ESI.  Returned call to his daughter and she will contact them to further discuss a repeat injection.

## 2016-12-16 ENCOUNTER — Other Ambulatory Visit: Payer: Self-pay | Admitting: Sports Medicine

## 2016-12-16 DIAGNOSIS — M48061 Spinal stenosis, lumbar region without neurogenic claudication: Secondary | ICD-10-CM

## 2016-12-17 ENCOUNTER — Other Ambulatory Visit: Payer: Self-pay | Admitting: Sports Medicine

## 2016-12-21 ENCOUNTER — Ambulatory Visit
Admission: RE | Admit: 2016-12-21 | Discharge: 2016-12-21 | Disposition: A | Discharge status: Home / Self Care | Payer: Medicare Other | Source: Ambulatory Visit | Attending: Sports Medicine | Admitting: Sports Medicine

## 2016-12-21 DIAGNOSIS — M48061 Spinal stenosis, lumbar region without neurogenic claudication: Secondary | ICD-10-CM

## 2016-12-21 MED ORDER — IOPAMIDOL (ISOVUE-M 200) INJECTION 41%
1.0000 mL | Freq: Once | INTRAMUSCULAR | Status: AC
  Administered 2016-12-21: 1 mL via EPIDURAL

## 2016-12-21 MED ORDER — METHYLPREDNISOLONE ACETATE 40 MG/ML INJ SUSP (RADIOLOG
120.0000 mg | Freq: Once | INTRAMUSCULAR | Status: AC
  Administered 2016-12-21: 120 mg via EPIDURAL

## 2016-12-21 NOTE — Discharge Instructions (Signed)
Post Procedure Spinal Discharge Instruction Sheet  1. You may resume a regular diet and any medications that you routinely take (including pain medications).  2. No driving day of procedure.  3. Light activity throughout the rest of the day.  Do not do any strenuous work, exercise, bending or lifting.  The day following the procedure, you can resume normal physical activity but you should refrain from exercising or physical therapy for at least three days thereafter.   Common Side Effects:   Headaches- take your usual medications as directed by your physician.  Increase your fluid intake.        Restlessness or inability to sleep- you may have trouble sleeping for the next few days.  Ask your referring physician if you need any medication for sleep.   Facial flushing or redness- should subside within a few days.   Increased pain- a temporary increase in pain a day or two following your procedure is not unusual.  Take your pain medication as prescribed by your referring physician.   Leg cramps  Please contact our office at [phone] for the following symptoms:  Fever greater than 100 degrees.  Headaches unresolved with medication after 2-3 days.  Increased swelling, pain, or redness at injection site.  Thank you for visiting our office.

## 2017-03-16 ENCOUNTER — Other Ambulatory Visit: Payer: Self-pay | Admitting: Sports Medicine

## 2017-03-16 DIAGNOSIS — M5416 Radiculopathy, lumbar region: Principal | ICD-10-CM

## 2017-03-16 DIAGNOSIS — M48061 Spinal stenosis, lumbar region without neurogenic claudication: Secondary | ICD-10-CM

## 2017-03-25 ENCOUNTER — Ambulatory Visit
Admission: RE | Admit: 2017-03-25 | Discharge: 2017-03-25 | Disposition: A | Discharge status: Home / Self Care | Payer: Medicare Other | Source: Ambulatory Visit | Attending: Sports Medicine | Admitting: Sports Medicine

## 2017-03-25 DIAGNOSIS — M5416 Radiculopathy, lumbar region: Principal | ICD-10-CM

## 2017-03-25 DIAGNOSIS — M48061 Spinal stenosis, lumbar region without neurogenic claudication: Secondary | ICD-10-CM

## 2017-03-25 MED ORDER — IOPAMIDOL (ISOVUE-M 200) INJECTION 41%
1.0000 mL | Freq: Once | INTRAMUSCULAR | Status: AC
  Administered 2017-03-25: 1 mL via EPIDURAL

## 2017-03-25 MED ORDER — METHYLPREDNISOLONE ACETATE 40 MG/ML INJ SUSP (RADIOLOG
120.0000 mg | Freq: Once | INTRAMUSCULAR | Status: AC
  Administered 2017-03-25: 120 mg via EPIDURAL

## 2017-03-25 NOTE — Discharge Instructions (Signed)

## 2017-04-22 ENCOUNTER — Other Ambulatory Visit: Payer: Self-pay | Admitting: Sports Medicine

## 2017-04-22 DIAGNOSIS — G8929 Other chronic pain: Secondary | ICD-10-CM

## 2017-04-22 DIAGNOSIS — M545 Low back pain: Principal | ICD-10-CM

## 2017-05-04 ENCOUNTER — Ambulatory Visit
Admission: RE | Admit: 2017-05-04 | Discharge: 2017-05-04 | Disposition: A | Discharge status: Home / Self Care | Payer: Medicare Other | Source: Ambulatory Visit | Attending: Sports Medicine | Admitting: Sports Medicine

## 2017-05-04 DIAGNOSIS — M545 Low back pain: Principal | ICD-10-CM

## 2017-05-04 DIAGNOSIS — G8929 Other chronic pain: Secondary | ICD-10-CM

## 2017-05-04 MED ORDER — IOPAMIDOL (ISOVUE-M 200) INJECTION 41%
1.0000 mL | Freq: Once | INTRAMUSCULAR | Status: AC
  Administered 2017-05-04: 1 mL via EPIDURAL

## 2017-05-04 MED ORDER — METHYLPREDNISOLONE ACETATE 40 MG/ML INJ SUSP (RADIOLOG
120.0000 mg | Freq: Once | INTRAMUSCULAR | Status: AC
  Administered 2017-05-04: 120 mg via EPIDURAL

## 2018-08-15 ENCOUNTER — Encounter: Payer: Self-pay | Admitting: Internal Medicine

## 2018-08-23 ENCOUNTER — Encounter: Payer: Self-pay | Admitting: Gastroenterology

## 2018-08-23 ENCOUNTER — Ambulatory Visit: Payer: Medicare Other | Admitting: Gastroenterology

## 2018-08-23 ENCOUNTER — Encounter: Payer: Self-pay | Admitting: *Deleted

## 2018-08-23 VITALS — BP 138/79 | HR 76 | Temp 97.4°F | Ht 67.0 in | Wt 157.8 lb

## 2018-08-23 DIAGNOSIS — R1310 Dysphagia, unspecified: Secondary | ICD-10-CM | POA: Diagnosis not present

## 2018-08-23 DIAGNOSIS — D509 Iron deficiency anemia, unspecified: Secondary | ICD-10-CM | POA: Diagnosis not present

## 2018-08-23 NOTE — Patient Instructions (Addendum)
We have arranged a special xray to evaluate your esophagus further.  Please have blood work done when you are able.  Further recommendations to follow!  It was a pleasure to see you today. I strive to create trusting relationships with patients to provide genuine, compassionate, and quality care. I value your feedback. If you receive a survey regarding your visit,  I greatly appreciate you taking time to fill this out.   Annitta Needs, PhD, ANP-BC [facility]

## 2018-08-23 NOTE — Progress Notes (Signed)
Primary Care Physician:  Glenda Chroman, MD Primary Gastroenterologist:  Dr. Gala Romney   Chief Complaint  Patient presents with  . Dysphagia  . Abnormal Lab    HPI:   Clifford [name] is an 85 y.o. male presenting today at the request of Dr. Woody Seller secondary to dysphagia and anemia. He has a history of chronic anemia dating back to 2013 in epic. Hgb was last 9.8 in July 2017,, microcytic pattern. Outside labs today from Jan 2020 with Hgb 11.1, Hct 35.8, Creatinine 1.33, BUN 17, Tbili 0.3, ALT < 5, AST 25.4, Alk Phos 66, albumin 4.4. EGD completed in July 2015 by Dr. Britta Mccreedy with decreased esophageal peristalsis, normal esophagus, duodenal lipoma. AAS Jan 2020 with nodular density in right lung base, recommending CT chest, which PCP is arranging.   He is taking iron at home. Feels tried. Lives at home with his brother. Daughter, [name], and patient's friend is present today. Notes rare abdominal pain. Sometimes full, sometimes not. No nausea. Dry foods and pills are difficult to swallow. Gets strangled with water sometimes. Feels like there are "knots" on his neck. States some type of thyroid biopsy 15 years ago. On Protonix for chronic GERD. Chronic constipation.   He does not want to pursue endoscopy just yet but will if he needs to. He believes he underwent dilatation in 2015 and noted some improvement with this.   Past Medical History:  Diagnosis Date  . Anemia    mild   . Arthritis   . CAD (coronary artery disease)    Catheterization September 18, 2010, EF 60%, small vessel disease and nonobstructive disease elsewhere, medical therapy /      post mi in 1981 (treated in [place] by Dr.zarotti)/nuclear stress ...april 2011...no ischemia/recurrent chest discomfort  march 2011  . Deafness in right ear   . GERD (gastroesophageal reflux disease)   . Hypertension   . Macular degeneration   . Pain    Hips, back, neck  . Palpitation    holter moniter..may 2011...normal sinus rhythum  PVC's  . Parkinson's disease ([place])   . Rheumatoid arthritis ([place])   . SOB (shortness of breath)   . Testicular cancer ([place])   . Unsteady gait     Past Surgical History:  Procedure Laterality Date  . APPENDECTOMY    . CARDIAC CATHETERIZATION     X 2  . cataracts    . COLONOSCOPY     unknown? remote past  . ESOPHAGOGASTRODUODENOSCOPY  2015   Dr. Britta Mccreedy: decreased esophageal peristalsis, normal esophagus, duodenal lipoma  . hemmorroid surgery  1984  . Shoulder operation  1996  . testicular cancer      Current Outpatient Medications  Medication Sig Dispense Refill  . carbidopa-levodopa (SINEMET IR) 25-100 MG tablet Take 1 tablet by mouth 3 (three) times daily. 90 tablet 6  . Cholecalciferol (VITAMIN D-3) 1000 units CAPS Take by mouth daily.    . Cyanocobalamin (VITAMIN B-12 PO) Take by mouth daily.    . Multiple Vitamins-Minerals (PRESERVISION AREDS PO) Take by mouth.    Marland Kitchen NAPROXEN PO Take by mouth as needed.    . pantoprazole (PROTONIX) 40 MG tablet Take 40 mg by mouth daily.  12  . testosterone cypionate (DEPOTESTOSTERONE CYPIONATE) 200 MG/ML injection Inject 1 mL into the muscle every 14 (fourteen) days.    . traMADol (ULTRAM) 50 MG tablet Take 1 tablet (50 mg total) by mouth every 6 (six) hours as needed. 30 tablet 1  No current facility-administered medications for this visit.     Allergies as of 08/23/2018  . (No Known Allergies)    Family History  Problem Relation Age of Onset  . Heart disease Mother   . Lung disease Father        from coal mine exposure  . Colon cancer Neg Hx     Social History   Socioeconomic History  . Marital status: Widowed    Spouse name: Not on file  . Number of children: 4  . Years of education: 4th  . Highest education level: Not on file  Occupational History  . Occupation: Theme park manager    Comment: retired Chemical engineer  Social Needs  . Financial resource strain: Not on file  . Food insecurity:    Worry: Not on file     Inability: Not on file  . Transportation needs:    Medical: Not on file    Non-medical: Not on file  Tobacco Use  . Smoking status: Former Smoker    Packs/day: 1.00    Years: 20.00    Pack years: 20.00    Last attempt to quit: 03/28/1962    Years since quitting: 56.4  . Smokeless tobacco: Never Used  Substance and Sexual Activity  . Alcohol use: No  . Drug use: No  . Sexual activity: Not on file  Lifestyle  . Physical activity:    Days per week: Not on file    Minutes per session: Not on file  . Stress: Not on file  Relationships  . Social connections:    Talks on phone: Not on file    Gets together: Not on file    Attends religious service: Not on file    Active member of club or organization: Not on file    Attends meetings of clubs or organizations: Not on file    Relationship status: Not on file  . Intimate partner violence:    Fear of current or ex partner: Not on file    Emotionally abused: Not on file    Physically abused: Not on file    Forced sexual activity: Not on file  Other Topics Concern  . Not on file  Social History Narrative   Lives at home with his son.   Right-handed.   3 cups coffee daily.    Review of Systems: Gen: see HPI CV: Denies chest pain, heart palpitations, peripheral edema, syncope.  Resp: Denies shortness of breath at rest or with exertion. Denies wheezing or cough.  GI: see HPI GU : Denies urinary burning, urinary frequency, urinary hesitancy MS: limited movement, uses cane Derm: Denies rash, itching, dry skin Psych: Denies depression, anxiety, memory loss, and confusion Heme: Denies bruising, bleeding, and enlarged lymph nodes.  Physical Exam: BP 138/79   Pulse 76   Temp (!) 97.4 F (36.3 C) (Oral)   Ht '5\' 7"'  (1.702 m)   Wt 157 lb 12.8 oz (71.6 kg)   BMI 24.71 kg/m  General:   Alert and oriented. Pleasant and cooperative. Frail-appearing Head:  Normocephalic and atraumatic. Eyes:  Without icterus, sclera clear and  conjunctiva pink.  Ears:  Hard of hearing  Nose:  No deformity, discharge,  or lesions. Mouth:  No deformity or lesions, oral mucosa pink.  Neck:  Query thyromegaly, possible left cervical adenopathy Lungs:  Clear to auscultation bilaterally.  Heart:  S1, S2 present without murmurs appreciated.  Abdomen:  +BS, soft, non-tender and non-distended. No HSM noted. No guarding or rebound. No  masses appreciated.  Rectal:  Deferred  Msk:  Kyphosis, shuffling gait consistent with history of Parkinson's Extremities:  Without  edema. Neurologic:  Alert and  oriented x4 Psych:  Alert and cooperative. Normal mood and affect.

## 2018-08-26 ENCOUNTER — Other Ambulatory Visit (HOSPITAL_COMMUNITY)
Admission: RE | Admit: 2018-08-26 | Discharge: 2018-08-26 | Disposition: A | Discharge status: Home / Self Care | Payer: Medicare Other | Source: Ambulatory Visit | Attending: Gastroenterology | Admitting: Gastroenterology

## 2018-08-26 ENCOUNTER — Ambulatory Visit (HOSPITAL_COMMUNITY)
Admission: RE | Admit: 2018-08-26 | Discharge: 2018-08-26 | Disposition: A | Discharge status: Home / Self Care | Payer: Medicare Other | Source: Ambulatory Visit | Attending: Gastroenterology | Admitting: Gastroenterology

## 2018-08-26 DIAGNOSIS — R1310 Dysphagia, unspecified: Secondary | ICD-10-CM

## 2018-08-26 DIAGNOSIS — D509 Iron deficiency anemia, unspecified: Secondary | ICD-10-CM | POA: Diagnosis not present

## 2018-08-26 LAB — FERRITIN: Ferritin: 13 ng/mL — ABNORMAL LOW (ref 24–336)

## 2018-08-26 LAB — IRON AND TIBC
Iron: 52 ug/dL (ref 45–182)
SATURATION RATIOS: 13 % — AB (ref 17.9–39.5)
TIBC: 409 ug/dL (ref 250–450)
UIBC: 357 ug/dL

## 2018-08-29 ENCOUNTER — Encounter: Payer: Self-pay | Admitting: Gastroenterology

## 2018-08-29 NOTE — Assessment & Plan Note (Signed)
Long-standing. Likely multifactorial in setting of renal disease, chronic disease. Check iron studies now.

## 2018-08-29 NOTE — Assessment & Plan Note (Signed)
Pleasant 85 year old male with history of dysphagia, noting difficulty with dry food and pills. Last EGD by Dr. Britta Mccreedy with letter enclosed by Dr. Britta Mccreedy to PCP but not actual procedure note. Highly likely dealing with motility disorder and that EGD/dilatation may not be helpful. However, he believes he noted improvement s/p EGD ?dilatation in 2015. Discussed pursuing EGD vs BPE. He would like to pursue BPE first. Continue with PPI daily, sitting upright with meals, chewing well with small bites. As of note, ?thyromegaly on exam. May need further imaging. BPE first as planned.

## 2018-08-29 NOTE — Progress Notes (Signed)
CC'D TO PCP °

## 2018-08-31 NOTE — Progress Notes (Signed)
BPE reviewed. He has some minimal aspiration and dysmotility as expected. No obvious ring, stricture. I doubt an endoscopy with dilatation would be helpful in this scenario. He has a small outpouching on the right side of his of larynx but it is small and doubt causing any issues. This is congenital. A neck ultrasound will be helpful if continues to feel that he has "knots" in his neck. ? I did note possible left-sided cervical adenopathy. Would recommend US neck and referral to Speech to start. Please arrange follow-up in 4-6 weeks.

## 2018-08-31 NOTE — Progress Notes (Signed)
He has IDA. Needs to continue iron BID. Please see BPE results.

## 2018-09-01 ENCOUNTER — Encounter: Payer: Self-pay | Admitting: Internal Medicine

## 2018-09-01 NOTE — Progress Notes (Signed)
PATIENT SCHEDULED AND LETTER SENT  °

## 2018-09-06 ENCOUNTER — Other Ambulatory Visit: Payer: Self-pay | Admitting: *Deleted

## 2018-09-06 DIAGNOSIS — R1310 Dysphagia, unspecified: Secondary | ICD-10-CM

## 2018-09-12 ENCOUNTER — Ambulatory Visit (HOSPITAL_COMMUNITY)
Admission: RE | Admit: 2018-09-12 | Discharge: 2018-09-12 | Disposition: A | Discharge status: Home / Self Care | Payer: Medicare Other | Source: Ambulatory Visit | Attending: Gastroenterology | Admitting: Gastroenterology

## 2018-09-12 DIAGNOSIS — R1310 Dysphagia, unspecified: Secondary | ICD-10-CM | POA: Diagnosis not present

## 2018-09-13 ENCOUNTER — Telehealth (HOSPITAL_COMMUNITY): Payer: Self-pay | Admitting: Internal Medicine

## 2018-09-13 NOTE — Progress Notes (Signed)
It has been added to notes

## 2018-09-13 NOTE — Progress Notes (Signed)
Thanks

## 2018-09-13 NOTE — Progress Notes (Signed)
Mindy: can you update this order to request MBSS? Spoke with Speech Genene Churn) about patient, and this would be most appropriate.

## 2018-09-13 NOTE — Telephone Encounter (Signed)
09/13/18  Left patient a message to inform about MBS where and when

## 2018-09-13 NOTE — Progress Notes (Signed)
US neck with no ultrasound abnormalities that correlate with physical exam. Radiologist recommending neck CT with IV contrast for further evaluation if clinically warranted. Let's keep his appt with Speech, and I will see him in April. Hold on further imaging for now.

## 2018-09-15 ENCOUNTER — Other Ambulatory Visit (HOSPITAL_COMMUNITY): Payer: Self-pay | Admitting: Specialist

## 2018-09-15 DIAGNOSIS — R1319 Other dysphagia: Secondary | ICD-10-CM

## 2018-09-19 ENCOUNTER — Other Ambulatory Visit: Payer: Self-pay

## 2018-09-19 ENCOUNTER — Ambulatory Visit (HOSPITAL_COMMUNITY)
Admission: RE | Admit: 2018-09-19 | Discharge: 2018-09-19 | Disposition: A | Discharge status: Home / Self Care | Payer: Medicare Other | Source: Ambulatory Visit | Attending: Gastroenterology | Admitting: Gastroenterology

## 2018-09-19 ENCOUNTER — Ambulatory Visit (HOSPITAL_COMMUNITY): Payer: Medicare Other | Admitting: Speech Pathology

## 2018-09-19 ENCOUNTER — Encounter (HOSPITAL_COMMUNITY): Payer: Self-pay | Admitting: Speech Pathology

## 2018-09-19 ENCOUNTER — Ambulatory Visit (HOSPITAL_COMMUNITY): Payer: Medicare Other | Attending: Internal Medicine | Admitting: Speech Pathology

## 2018-09-19 DIAGNOSIS — R1312 Dysphagia, oropharyngeal phase: Secondary | ICD-10-CM | POA: Insufficient documentation

## 2018-09-19 DIAGNOSIS — R1319 Other dysphagia: Secondary | ICD-10-CM | POA: Diagnosis present

## 2018-09-19 NOTE — Therapy (Signed)
[place], [place], [address] Phone: [phone]   Fax:  [phone]  Modified Barium Swallow  Patient Details  Name: Clifford [name] MRN: [id number] Date of Birth: 04-Oct-1931 No data recorded  Encounter Date: 09/19/2018  End of Session - 09/19/18 1604    Visit Number  1    Number of Visits  1    Authorization Type  [employer] Medicare    SLP Start Time  9323    SLP Stop Time   1423    SLP Time Calculation (min)  38 min    Activity Tolerance  Patient tolerated treatment well       Past Medical History:  Diagnosis Date  . Anemia    mild   . Arthritis   . CAD (coronary artery disease)    Catheterization September 18, 2010, EF 60%, small vessel disease and nonobstructive disease elsewhere, medical therapy /      post mi in 1981 (treated in [place] by Dr.zarotti)/nuclear stress ...april 2011...no ischemia/recurrent chest discomfort  march 2011  . Deafness in right ear   . GERD (gastroesophageal reflux disease)   . Hypertension   . Macular degeneration   . Pain    Hips, back, neck  . Palpitation    holter moniter..may 2011...normal sinus rhythum PVC's  . Parkinson's disease ([place])   . Rheumatoid arthritis ([place])   . SOB (shortness of breath)   . Testicular cancer ([place])   . Unsteady gait     Past Surgical History:  Procedure Laterality Date  . APPENDECTOMY    . CARDIAC CATHETERIZATION     X 2  . cataracts    . COLONOSCOPY     unknown? remote past  . ESOPHAGOGASTRODUODENOSCOPY  2015   Dr. Britta Mccreedy: decreased esophageal peristalsis, normal esophagus, duodenal lipoma  . hemmorroid surgery  1984  . Shoulder operation  1996  . testicular cancer      There were no vitals filed for this visit.  Subjective Assessment - 09/19/18 1552    Subjective  "I feel like foods won't go down sometimes."    Special Tests  MBSS    Currently in Pain?  No/denies          General - 09/19/18 1553      General  Information   Date of Onset  08/13/18    HPI  Clifford [name] is an 85 yo male who was referred for MBSS by Roseanne Kaufman, NP due to Pt with reports of dysphagia. He has Parkinson's disease and takes Sinemet 3x/daily as prescribed by his PCP, Dr. Jerene Bears. 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 He had [place] soft tissure neck last week which was normal (Neck CT recommended to evaluate for pharyngocele). Pt indicates that he has trouble swallowing solid foods and feels they often get "stuck". He also states that some liquids come up toward his nasal cavity.     Type of Study  MBS-Modified Barium Swallow Study    Previous Swallow Assessment  Barium swallow in Feb 2020; aspiration of thin contrast    Diet Prior to this Study  Regular;Thin liquids    Temperature Spikes Noted  No    Respiratory Status  Room air    History of Recent Intubation  No    Behavior/Cognition  Alert;Cooperative    Oral Cavity Assessment  Within Functional Limits    Oral Care Completed by SLP  No    Oral Cavity - Dentition  Dentures, top;Dentures, bottom    Vision  Functional for self feeding    Self-Feeding Abilities  Able to feed self    Patient Positioning  Upright in chair    Baseline Vocal Quality  Normal    Volitional Cough  Strong    Volitional Swallow  Able to elicit    Anatomy  Within functional limits    Pharyngeal Secretions  Not observed secondary MBS       Adult Oral Care Protocol - 09/19/18 1554      Oral Assessment (Complete on admission/transfer/change in patient condition)   Does patient have any of the following "high(er) risk" factors?  None of the above    Does patient have any of the following "at risk" factors?  None of the above       Oral Preparation/Oral Phase - 09/19/18 1554      Oral Preparation/Oral Phase   Oral Phase   Within functional limits      Electrical stimulation - Oral Phase   Was Electrical Stimulation Used  No       Pharyngeal Phase - 09/19/18 1554      Pharyngeal Phase   Pharyngeal Phase  Impaired      Pharyngeal - Thin   Pharyngeal- Thin Teaspoon  Swallow initiation at pyriform sinus;Penetration/Aspiration before swallow;Trace aspiration;Reduced airway/laryngeal closure;Swallow initiation at vallecula   premature spillage   Pharyngeal  Material does not enter airway;Material enters airway, passes BELOW cords and not ejected out despite cough attempt by patient    Pharyngeal- Thin Cup  Swallow initiation at vallecula;Penetration/Aspiration before swallow    Pharyngeal  Material does not enter airway;Material enters airway, remains ABOVE vocal cords then ejected out    Pharyngeal- Thin Straw  Swallow initiation at pyriform sinus;Reduced airway/laryngeal closure;Penetration/Aspiration during swallow;Penetration/Apiration after swallow;Trace aspiration;Pharyngeal residue - pyriform;Pharyngeal residue - valleculae    Pharyngeal  Material enters airway, passes BELOW cords without attempt by patient to eject out (silent aspiration)      Pharyngeal - Solids   Pharyngeal- Puree  Swallow initiation at vallecula;Pharyngeal residue - valleculae    Pharyngeal- Regular  Delayed swallow initiation-vallecula;Pharyngeal residue - valleculae;Reduced tongue base retraction;Reduced pharyngeal peristalsis    Pharyngeal- Pill  Within functional limits      Pharyngeal Phase - Comment   Pharyngeal Comment  Premature spillage lead to penetration and trace aspiration on occasion before the swallow with teaspoon thins and straw sips      Electrical Stimulation - Pharyngeal Phase   Was Electrical Stimulation Used  No       Cricopharyngeal Phase - 09/19/18 1602      Cervical Esophageal Phase   Cervical Esophageal Phase  --   Pt needed bite puree to clear pill from esophagus        Plan - 09/19/18 1604     Clinical Impression Statement  Pt presents with mild oropharyngeal phase dysphagia characterized by premature spillage with thin liquids and swallow trigger at the level of the valleculae and the pyriforms on occasion with penetration before and trace aspiration before the swallow with teaspoon and straw sip presentations of thin. Pt with flash penetration of thins via cup sip before the swallow without aspiration. Aspiration with teaspoon presentation was sensed with a cough, however not completely removed and aspiration with straw sips was silent. Pt noted to have min to mi/mod vallecular residue after regular  textures, however this decreased with spontaneous repeat swallow and also with cued liquid wash. Pt had one episode of trace amount of thin liquid backflow behind uvulae, but not into nasal passage. Recommend regular textures with care to chop meats into bite sized pieces and small, cup sips of thin liquids, no straws; Pt to swallow 2x for each bite/sip and clear throat periodically This study was reviewed with the Pt and recommendations were written down. Pt may benefit from SLP therapy given Parkinson's disease to focus on dysphagia and dysarthria/voice, however he prefers a home health setting as he lives 45 minutes away. Pt was given my contact information should he have further thoughts or questions.    Treatment/Interventions  Patient/family education;Compensatory strategies    Potential to Achieve Goals  Good    Consulted and Agree with Plan of Care  Patient       Patient will benefit from skilled therapeutic intervention in order to improve the following deficits and impairments:   Dysphagia, oropharyngeal phase    Recommendations/Treatment - 09/19/18 1602      Swallow Evaluation Recommendations   SLP Diet Recommendations  Thin;Age appropriate regular    Liquid Administration via  Cup;No straw    Medication Administration  Whole meds with liquid    Supervision  Patient able to  self feed    Compensations  Small sips/bites;Multiple dry swallows after each bite/sip;Clear throat intermittently;Effortful swallow    Postural Changes  Seated upright at 90 degrees;Remain upright for at least 30 minutes after feeds/meals       Prognosis - 09/19/18 1603      Prognosis   Prognosis for Safe Diet Advancement  Good      Individuals Consulted   Consulted and Agree with Results and Recommendations  Patient    Report Sent to   Referring physician       Problem List Patient Active Problem List   Diagnosis Date Noted  . Dysphagia 08/23/2018  . Microcytic anemia 08/23/2018  . Abnormality of gait 01/15/2016  . Parkinsonian features 01/15/2016  . CAD (coronary artery disease)   . Hypertension   . SOB (shortness of breath)   . Palpitation   . HYPERTENSION 09/15/2010  . ANEMIA, MILD 12/03/2009  . CAD 12/03/2009  . COPD 12/03/2009  . OTHER MALAISE AND FATIGUE 12/03/2009  . CHEST PAIN UNSPECIFIED 12/03/2009  . OTH NONSPECIFIC ABNORM CV SYSTEM FUNCTION STUDY 12/03/2009  . PALPITATIONS, HX OF 12/03/2009   Thank you,  Genene Churn, [place]  [facility] 09/19/2018, 4:08 PM  [place] [address] [place], [place], [address] Phone: [phone]   Fax:  [phone]  Name: Clifford [name] MRN: [id number] Date of Birth: [birth year]

## 2018-09-20 ENCOUNTER — Telehealth: Payer: Self-pay | Admitting: Gastroenterology

## 2018-09-20 MED ORDER — POLYSACCHAR IRON-FA-B12 150-1-25 MG-MG-MCG PO CAPS: 1.0000 | ORAL_CAPSULE | Freq: Every day | ORAL | 5 refills | Status: AC

## 2018-09-20 NOTE — Progress Notes (Signed)
MBSS reviewed. He has mild oropharyngeal dysphagia. Speech recommending regular textures with chopped meats into bite-sized pieces, small sips of thin liquids and no straws. Needs to swallow 2X for each bite/sip and clear throat periodically. Patient should already have these recommendations from Speech. If he has more esophageal symptoms in the future, we can pursue EGD +/- dilatation. For now, follow Speech recommendations. I will be seeing him in April.

## 2018-09-20 NOTE — Telephone Encounter (Signed)
Ferrex forte sent to pharmacy.

## 2018-09-20 NOTE — Progress Notes (Signed)
I sent in iron. Let me know if there are issues with coverage.

## 2018-09-28 ENCOUNTER — Other Ambulatory Visit: Payer: Self-pay

## 2018-09-28 ENCOUNTER — Ambulatory Visit: Payer: Medicare Other | Admitting: Pulmonary Disease

## 2018-09-28 ENCOUNTER — Encounter: Payer: Self-pay | Admitting: Pulmonary Disease

## 2018-09-28 VITALS — BP 132/82 | HR 75 | Ht 67.0 in | Wt 161.2 lb

## 2018-09-28 DIAGNOSIS — J984 Other disorders of lung: Secondary | ICD-10-CM

## 2018-09-28 DIAGNOSIS — J920 Pleural plaque with presence of asbestos: Secondary | ICD-10-CM

## 2018-09-28 DIAGNOSIS — R911 Solitary pulmonary nodule: Secondary | ICD-10-CM

## 2018-09-28 DIAGNOSIS — Z87891 Personal history of nicotine dependence: Secondary | ICD-10-CM

## 2018-09-28 NOTE — Patient Instructions (Addendum)
Thank you for visiting Dr. Valeta Harms at [facility]. Today we recommend the following:  We will have a PET CT scan completed in 3 months. Return to clinic after the imaging to discuss results and future plans.  Return in about 3 months (around 12/29/2018).  Following PET imaging

## 2018-09-28 NOTE — Progress Notes (Signed)
Synopsis: Referred in March 2020 for right-sided lung nodule by Glenda Chroman, MD  Subjective:   PATIENT ID: Clifford [name] GENDER: male DOB: 1931/09/27, MRN: [id number]  Chief Complaint  Patient presents with  . Pulmonary consult    mass on right lung, SOB with cold air, some cough non-productive    This is a 85 year old gentleman with a past medical history of Parkinson's disease.  History of rheumatoid arthritis since the 1980s.  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  Presents today for evaluation of a right-sided lung nodule that was found on incidental CT imaging.  He was having right-sided pains within the chest and had imaging which revealed presence of the nodule.  He was referred here for further evaluation.  Patient denies fevers chills night sweats weight loss.  He has not had any hemoptysis.  He does however have rather advanced Parkinson's disease.  He ambulates with a cane.  He does have baseline slow speech and slow gait.  He has had discussions with family his daughter is present.  They understand that this could very well represent a lung cancer however they do not believe that they would ever want to seek treatments for this.  They are here today to discuss about additional steps on whether we should consider observation of the lesion.   Past Medical History:  Diagnosis Date  . Anemia    mild   . Arthritis   . CAD (coronary artery disease)    Catheterization September 18, 2010, EF 60%, small vessel disease and nonobstructive disease elsewhere, medical therapy /      post mi in 1981 (treated in [place] by Dr.zarotti)/nuclear stress ...april 2011...no ischemia/recurrent chest discomfort  march 2011  . Deafness in right ear   . GERD (gastroesophageal reflux disease)   . Hypertension   . Macular degeneration   . Pain    Hips, back, neck  . Palpitation    holter  moniter..may 2011...normal sinus rhythum PVC's  . Parkinson's disease ([place])   . Rheumatoid arthritis ([place])   . SOB (shortness of breath)   . Testicular cancer ([place])   . Unsteady gait      Family History  Problem Relation Age of Onset  . Heart disease Mother   . Lung disease Father        from coal mine exposure  . Colon cancer Neg Hx      Past Surgical History:  Procedure Laterality Date  . APPENDECTOMY    . CARDIAC CATHETERIZATION     X 2  . cataracts    . COLONOSCOPY     unknown? remote past  . ESOPHAGOGASTRODUODENOSCOPY  2015   Dr. Britta Mccreedy: decreased esophageal peristalsis, normal esophagus, duodenal lipoma  . hemmorroid surgery  1984  . Shoulder operation  1996  . testicular cancer      Social History   Socioeconomic History  . Marital status: Widowed    Spouse name: Not on file  . Number of children: 4  . Years of education: 4th  . Highest education level: Not on file  Occupational History  . Occupation: Theme park manager    Comment: retired Chemical engineer  Social Needs  . Financial resource strain: Not on file  . Food insecurity:    Worry: Not on file    Inability: Not on file  . Transportation needs:    Medical:  Not on file    Non-medical: Not on file  Tobacco Use  . Smoking status: Former Smoker    Packs/day: 1.00    Years: 20.00    Pack years: 20.00    Last attempt to quit: 03/28/1962    Years since quitting: 56.5  . Smokeless tobacco: Never Used  Substance and Sexual Activity  . Alcohol use: No  . Drug use: No  . Sexual activity: Not on file  Lifestyle  . Physical activity:    Days per week: Not on file    Minutes per session: Not on file  . Stress: Not on file  Relationships  . Social connections:    Talks on phone: Not on file    Gets together: Not on file    Attends religious service: Not on file    Active member of club or organization: Not on file    Attends meetings of clubs or organizations: Not on file    Relationship status: Not on file   . Intimate partner violence:    Fear of current or ex partner: Not on file    Emotionally abused: Not on file    Physically abused: Not on file    Forced sexual activity: Not on file  Other Topics Concern  . Not on file  Social History Narrative   Lives at home with his son.   Right-handed.   3 cups coffee daily.     No Known Allergies   Outpatient Medications Prior to Visit  Medication Sig Dispense Refill  . carbidopa-levodopa (SINEMET IR) 25-100 MG tablet Take 1 tablet by mouth 3 (three) times daily. 90 tablet 6  . Cholecalciferol (VITAMIN D-3) 1000 units CAPS Take by mouth daily.    . Cyanocobalamin (VITAMIN B-12 PO) Take by mouth daily.    . Multiple Vitamins-Minerals (PRESERVISION AREDS PO) Take by mouth.    Marland Kitchen NAPROXEN PO Take by mouth as needed.    . pantoprazole (PROTONIX) 40 MG tablet Take 40 mg by mouth daily.  12  . Polysacchar Iron-FA-B12 (FERREX 150 FORTE) 150-1-25 MG-MG-MCG CAPS Take 1 capsule by mouth daily. 30 capsule 5  . testosterone cypionate (DEPOTESTOSTERONE CYPIONATE) 200 MG/ML injection Inject 1 mL into the muscle every 14 (fourteen) days.    . traMADol (ULTRAM) 50 MG tablet Take 1 tablet (50 mg total) by mouth every 6 (six) hours as needed. 30 tablet 1   No facility-administered medications prior to visit.     Review of Systems  Constitutional: Negative for chills, fever, malaise/fatigue and weight loss.  HENT: Negative for hearing loss, sore throat and tinnitus.   Eyes: Negative for blurred vision and double vision.  Respiratory: Negative for cough, hemoptysis, sputum production, shortness of breath, wheezing and stridor.   Cardiovascular: Negative for chest pain, palpitations, orthopnea, leg swelling and PND.  Gastrointestinal: Negative for abdominal pain, constipation, diarrhea, heartburn, nausea and vomiting.  Genitourinary: Negative for dysuria, hematuria and urgency.  Musculoskeletal: Negative for joint pain and myalgias.  Skin: Negative for  itching and rash.  Neurological: Positive for tremors and weakness. Negative for dizziness, tingling and headaches.  Endo/Heme/Allergies: Negative for environmental allergies. Does not bruise/bleed easily.  Psychiatric/Behavioral: Negative for depression. The patient is not nervous/anxious and does not have insomnia.   All other systems reviewed and are negative.    Objective:  Physical Exam Vitals signs reviewed.  Constitutional:      General: He is not in acute distress.    Appearance: He is well-developed.  HENT:  Head: Normocephalic and atraumatic.  Eyes:     General: No scleral icterus.    Conjunctiva/sclera: Conjunctivae normal.     Pupils: Pupils are equal, round, and reactive to light.     Comments: Loss of glabellar reflex  Neck:     Musculoskeletal: Neck supple.     Vascular: No JVD.     Trachea: No tracheal deviation.  Cardiovascular:     Rate and Rhythm: Normal rate and regular rhythm.     Heart sounds: Normal heart sounds. No murmur.  Pulmonary:     Effort: Pulmonary effort is normal. No tachypnea, accessory muscle usage or respiratory distress.     Breath sounds: Normal breath sounds. No stridor. No wheezing, rhonchi or rales.  Abdominal:     General: Bowel sounds are normal. There is no distension.     Palpations: Abdomen is soft.     Tenderness: There is no abdominal tenderness.  Musculoskeletal:        General: No tenderness.  Lymphadenopathy:     Cervical: No cervical adenopathy.  Skin:    General: Skin is warm and dry.     Capillary Refill: Capillary refill takes less than 2 seconds.     Findings: No rash.  Neurological:     Mental Status: He is alert and oriented to person, place, and time.     Coordination: Coordination abnormal.     Gait: Gait abnormal.     Comments: Cogwheel rigidity of the both upper extremities.  Psychiatric:        Behavior: Behavior normal.     Comments: Flat almost emotionless face      Vitals:   09/28/18 1010   BP: 132/82  Pulse: 75  SpO2: 97%  Weight: 161 lb 3.2 oz (73.1 kg)  Height: 5\' 7"  (1.702 m)   97% on RA BMI Readings from Last 3 Encounters:  09/28/18 25.25 kg/m  08/23/18 24.71 kg/m  01/15/16 26.14 kg/m   Wt Readings from Last 3 Encounters:  09/28/18 161 lb 3.2 oz (73.1 kg)  08/23/18 157 lb 12.8 oz (71.6 kg)  01/15/16 159 lb 8 oz (72.3 kg)     CBC    Component Value Date/Time   WBC 6.2 01/15/2016 1612   RBC 4.17 01/15/2016 1612   HGB 9.8 (L) 01/15/2016 1612   HCT 32.3 (L) 01/15/2016 1612   PLT 230 01/15/2016 1612   MCV 78 (L) 01/15/2016 1612   MCH 23.5 (L) 01/15/2016 1612   MCHC 30.3 (L) 01/15/2016 1612   RDW 16.5 (H) 01/15/2016 1612     Chest Imaging: CT chest 08/31/2018: Right-sided 2 cm lung nodule with cavitation, thick-walled. Concerning for primary bronchogenic carcinoma. The patient's images have been independently reviewed by me.    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  He is a former smoker however quit greater than 50 years ago.  At this point a long discussion was had between the patient and the patient's daughter in the room.  We discussed the risk benefits and alternatives of proceeding with any invasive diagnostics to prove the etiology of this lung nodule.  There is a concern based on imaging that it does represent a primary bronchogenic carcinoma.  At this time the patient and patient's daughter have  collectively made the decision that they would only like to observe this.  Even if this was a cancer they would not want to pursue treatments for this due to the patient's additional medical comorbidities.  Therefore, we will make the recommendation to proceed with a 45-month PET CT image.  At that point if the PET scan is positive and the lesion has grown and is consistent with a cancer they would prefer to continue to observe.  At some point they would like to have recommendations for transition to hospice versus outpatient palliative care.  I do believe that the patient would be a good candidate for early initiation of home palliative care.  With making plans to transition towards hospice care if there was a significant decline in the patient's health.  At this time we will see the patient back in 3 months following the PET CT scan to review images.  Greater than 50% of this patient 60-minute office visit was spent face-to-face discussing above recommendations and counseling on the appropriate diagnostic algorithm as stated above.   Current Outpatient Medications:  .  carbidopa-levodopa (SINEMET IR) 25-100 MG tablet, Take 1 tablet by mouth 3 (three) times daily., Disp: 90 tablet, Rfl: 6 .  Cholecalciferol (VITAMIN D-3) 1000 units CAPS, Take by mouth daily., Disp: , Rfl:  .  Cyanocobalamin (VITAMIN B-12 PO), Take by mouth daily., Disp: , Rfl:  .  Multiple Vitamins-Minerals (PRESERVISION AREDS PO), Take by mouth., Disp: , Rfl:  .  NAPROXEN PO, Take by mouth as needed., Disp: , Rfl:  .  pantoprazole (PROTONIX) 40 MG tablet, Take 40 mg by mouth daily., Disp: , Rfl: 12 .  Polysacchar Iron-FA-B12 (FERREX 150 FORTE) 150-1-25 MG-MG-MCG CAPS, Take 1 capsule by mouth daily., Disp: 30 capsule, Rfl: 5 .  testosterone cypionate (DEPOTESTOSTERONE CYPIONATE) 200 MG/ML injection, Inject 1 mL into the muscle every 14 (fourteen) days., Disp: , Rfl:  .  traMADol (ULTRAM) 50 MG tablet, Take 1 tablet (50 mg total) by  mouth every 6 (six) hours as needed., Disp: 30 tablet, Rfl: 1   Garner Nash, DO [facility] 09/28/2018 10:37 AM

## 2018-10-18 ENCOUNTER — Encounter: Payer: Self-pay | Admitting: *Deleted

## 2018-10-28 ENCOUNTER — Ambulatory Visit (INDEPENDENT_AMBULATORY_CARE_PROVIDER_SITE_OTHER): Payer: Medicare Other | Admitting: Gastroenterology

## 2018-10-28 ENCOUNTER — Other Ambulatory Visit: Payer: Self-pay

## 2018-10-28 ENCOUNTER — Encounter: Payer: Self-pay | Admitting: Gastroenterology

## 2018-10-28 ENCOUNTER — Encounter: Payer: Self-pay | Admitting: Internal Medicine

## 2018-10-28 DIAGNOSIS — R1310 Dysphagia, unspecified: Secondary | ICD-10-CM

## 2018-10-28 NOTE — Progress Notes (Signed)
Primary Care Physician:  Glenda Chroman, MD  Primary GI:   Virtual Visit via Telephone Note Due to COVID-19, visit is conducted virtually and was requested by patient.   I connected with Clifford [name] on 10/28/18 at 10:30 AM EDT by telephone and verified that I am speaking with the correct person using two identifiers. Son, Clifford [name], present for phone call.    I discussed the limitations, risks, security and privacy concerns of performing an evaluation and management service by telephone and the availability of in person appointments. I also discussed with the patient that there may be a patient responsible charge related to this service. The patient expressed understanding and agreed to proceed.  Chief Complaint  Patient presents with   Dysphagia    f/u. " no change really"     History of Present Illness: 85 year old male for phone visit today, history of dysphagia, chronic anemia dating back to 2013 in epic. Hgb was last 9.8 in July 2017,, microcytic pattern. Outside labs from Jan 2020 with Hgb 11.1, Hct 35.8, Creatinine 1.33, BUN 17, Tbili 0.3, ALT < 5, AST 25.4, Alk Phos 66, albumin 4.4. EGD completed in July 2015 by Dr. Britta Mccreedy with  normal esophagus, duodenal lipoma. He noted improvement with dilatation in the past. At last visit, he didn't want to pursue endoscopic evaluation just yet. Ferritin low and started on oral iron.   BPE Feb 2020 with esophageal dysmotility and suspected small right lateral pharyngocele. MBSS with Speech thereafter with impression of mild oropharyngeal dysphagia, recommending regular textures with chopped meats into bite-sized pieces, small, cup sips of thin liquids and no straws, swallow two times for each bite/sip and clear throat periodically. Recommended SLP therapy with history of Parkinson's disease to focus on dysphagia and dysarthria. Son provides majority of information today. States swallowing is better with the compensatory strategies. Son has  not seen any major problems; however, patient notes he continues to have difficulties with solid foods and pills.   Constipation: takes a laxative about the first of the week.     Past Medical History:  Diagnosis Date   Anemia    mild    Arthritis    CAD (coronary artery disease)    Catheterization September 18, 2010, EF 60%, small vessel disease and nonobstructive disease elsewhere, medical therapy /      post mi in 1981 (treated in [place] by Dr.zarotti)/nuclear stress ...april 2011...no ischemia/recurrent chest discomfort  march 2011   Deafness in right ear    GERD (gastroesophageal reflux disease)    Hypertension    Macular degeneration    Pain    Hips, back, neck   Palpitation    holter moniter..may 2011...normal sinus rhythum PVC's   Parkinson's disease ([place])    Rheumatoid arthritis ([place])    SOB (shortness of breath)    Testicular cancer ([place])    Unsteady gait      Past Surgical History:  Procedure Laterality Date   APPENDECTOMY     CARDIAC CATHETERIZATION     X 2   cataracts     COLONOSCOPY     unknown? remote past   ESOPHAGOGASTRODUODENOSCOPY  2015   Dr. Britta Mccreedy: decreased esophageal peristalsis, normal esophagus, duodenal lipoma   hemmorroid surgery  1984   Shoulder operation  1996   testicular cancer       Current Meds  Medication Sig   carbidopa-levodopa (SINEMET IR) 25-100 MG tablet Take 1 tablet by mouth 3 (three) times daily.  Cholecalciferol (VITAMIN D-3) 1000 units CAPS Take by mouth daily.   Cyanocobalamin (VITAMIN B-12 PO) Take by mouth daily.   Multiple Vitamins-Minerals (PRESERVISION AREDS PO) Take by mouth.   NAPROXEN PO Take by mouth as needed.   pantoprazole (PROTONIX) 40 MG tablet Take 40 mg by mouth daily.   Polysacchar Iron-FA-B12 (FERREX 150 FORTE) 150-1-25 MG-MG-MCG CAPS Take 1 capsule by mouth daily.   testosterone cypionate (DEPOTESTOSTERONE CYPIONATE) 200 MG/ML injection Inject 1 mL into the muscle  every 14 (fourteen) days.   traMADol (ULTRAM) 50 MG tablet Take 1 tablet (50 mg total) by mouth every 6 (six) hours as needed.       Observations/Objective: No distress. Unable to perform physical exam due to telephone encounter. No video available.   Assessment and Plan: 85 year old male with history of Parkinson's, noting dysphagia that is multifactorial. Known dysmotility, no stricture on BPE. Speech Pathology consultation appreciated and appears to have an oropharyngeal component, recommending therapy. Patient declined this previously due to travel distance. He notes sensation of something stuck to the right of his throat. Small right lateral pharyngocele on imaging. Doubt EGD would offer much, but he does note improvement with dilatation in the past. Discussed we would revisit this in the next few months but in interim investigate best options for speech therapy.   Anemia: multifactorial with iron deficiency component. No overt GI bleeding. Would not recommend colonoscopy at this point. Unknown baseline iron. Continue oral iron supplementation.   Return in 3 months for visit. I am reaching out to [employer], CCC-SLP for therapy options in light of COVID restrictions.   Follow Up Instructions: See AVS    I discussed the assessment and treatment plan with the patient. The patient was provided an opportunity to ask questions and all were answered. The patient agreed with the plan and demonstrated an understanding of the instructions.   The patient was advised to call back or seek an in-person evaluation if the symptoms worsen or if the condition fails to improve as anticipated.  I provided 15 minutes of non-face-to-face time during this encounter.  Clifford Needs, PhD, ANP-BC [facility]

## 2018-10-28 NOTE — Progress Notes (Signed)
cc'ed to pcp °

## 2018-10-28 NOTE — Patient Instructions (Addendum)
I will be talking with Speech Pathology about the next best options.  We may need to do an upper endoscopy in the future once things settle down.  For now, continue with chopped meats in bite-sized pieces, cup sips of thin liquids, no straws, swallowing two times for every bite/sip. Ensure your clear your throat periodically.  We will see you in 3 months!  I enjoyed talking with you again today! As you know, I value our relationship and want to provide genuine, compassionate, and quality care. I welcome your feedback. If you receive a survey regarding your visit,  I greatly appreciate you taking time to fill this out. See you next time!  Annitta Needs, PhD, ANP-BC [facility]  '

## 2018-11-03 ENCOUNTER — Telehealth (HOSPITAL_COMMUNITY): Payer: Self-pay | Admitting: Speech Pathology

## 2018-11-03 NOTE — Telephone Encounter (Signed)
Telephone Call  SLP contacted Pt remotely due to [place] restrictions regarding possible follow up appointment with SLP. Pt had MBSS March 2020 with recommendations for no straws, small bites/sips, and cut up regular texture meats. SLP spoke with Pt and son after identity verified. Pt's son, [name], reports that Pt has been following MBSS recommendations and "seems to be doing better". [name] is not interested in bringing his father in for therapy at this time due to COVID risks, which is appreciated. They have my contact information and were encouraged to call if there are any changes.  Thank you,  Genene Churn, CCC-SLP [phone]

## 2018-12-29 ENCOUNTER — Ambulatory Visit (HOSPITAL_COMMUNITY): Payer: Medicare Other

## 2018-12-29 ENCOUNTER — Other Ambulatory Visit: Payer: Self-pay

## 2018-12-29 ENCOUNTER — Ambulatory Visit (HOSPITAL_COMMUNITY)
Admission: RE | Admit: 2018-12-29 | Discharge: 2018-12-29 | Disposition: A | Discharge status: Home / Self Care | Payer: Medicare Other | Source: Ambulatory Visit | Attending: Pulmonary Disease | Admitting: Pulmonary Disease

## 2018-12-29 DIAGNOSIS — J948 Other specified pleural conditions: Secondary | ICD-10-CM | POA: Insufficient documentation

## 2018-12-29 DIAGNOSIS — J929 Pleural plaque without asbestos: Secondary | ICD-10-CM | POA: Diagnosis not present

## 2018-12-29 DIAGNOSIS — I2510 Atherosclerotic heart disease of native coronary artery without angina pectoris: Secondary | ICD-10-CM | POA: Insufficient documentation

## 2018-12-29 DIAGNOSIS — N400 Enlarged prostate without lower urinary tract symptoms: Secondary | ICD-10-CM | POA: Insufficient documentation

## 2018-12-29 DIAGNOSIS — R911 Solitary pulmonary nodule: Secondary | ICD-10-CM | POA: Diagnosis not present

## 2018-12-29 DIAGNOSIS — M47816 Spondylosis without myelopathy or radiculopathy, lumbar region: Secondary | ICD-10-CM | POA: Diagnosis not present

## 2018-12-29 DIAGNOSIS — I700 Atherosclerosis of aorta: Secondary | ICD-10-CM | POA: Diagnosis not present

## 2018-12-29 LAB — GLUCOSE, CAPILLARY: Glucose-Capillary: 89 mg/dL (ref 70–99)

## 2018-12-29 MED ORDER — FLUDEOXYGLUCOSE F - 18 (FDG) INJECTION
7.4000 | Freq: Once | INTRAVENOUS | Status: AC
  Administered 2018-12-29: 7.4 via INTRAVENOUS

## 2018-12-30 ENCOUNTER — Ambulatory Visit (INDEPENDENT_AMBULATORY_CARE_PROVIDER_SITE_OTHER): Payer: Medicare Other | Admitting: Nurse Practitioner

## 2018-12-30 ENCOUNTER — Encounter: Payer: Self-pay | Admitting: Nurse Practitioner

## 2018-12-30 ENCOUNTER — Other Ambulatory Visit: Payer: Self-pay

## 2018-12-30 DIAGNOSIS — R942 Abnormal results of pulmonary function studies: Secondary | ICD-10-CM | POA: Insufficient documentation

## 2018-12-30 NOTE — Assessment & Plan Note (Signed)
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  The PET scan also showed a large right hydropneumothorax.  Patient states that he has not had any difficulty breathing and no pain on his right side.  States that he is at his baseline at this time.  He does have a son that lives with him and helps provide care for him.  We did discuss hospice versus outpatient palliative care.  Discussed the PET scan with Dr. Valeta Harms and we do believe the patient would be a good candidate for early initiation of home palliative care but at this time family refuses.  They would be open to hospice or palliative care if patient's health starts to decline.  Plan: Patient Instructions  Discussed PET scan with patient and family Please call if patient's condition changes. At this time patient is stable. Patient has good family support  Follow up: Follow up with Dr. Valeta Harms in 1 month or sooner if needed

## 2018-12-30 NOTE — Patient Instructions (Signed)
Discussed PET scan with patient and family Please call if patient's condition changes. At this time patient is stable. Patient has good family support  Follow up: Follow up with Dr. Valeta Harms in 1 month or sooner if needed

## 2018-12-30 NOTE — Progress Notes (Signed)
Virtual Visit via Telephone Note  I connected with Clifford [name] on 12/30/18 at  2:30 PM EDT by telephone and verified that I am speaking with the correct person using two identifiers.  Location: Patient: home Provider: office   I discussed the limitations, risks, security and privacy concerns of performing an evaluation and management service by telephone and the availability of in person appointments. I also discussed with the patient that there may be a patient responsible charge related to this service. The patient expressed understanding and agreed to proceed.   History of Present Illness: 85 year old male number smoker with lung nodule who is followed by Dr. Valeta Harms.  Patient has a tele-visit today to discuss recent PET scan results.  He was last seen by Dr. Valeta Harms on 09/28/2018 for further evaluation of CT scan which showed right lung nodule.  Patient does have advanced Parkinson's disease and does have baseline slow speech and slow gait.  He ambulates with a cane.  His daughter is on the phone with him today to discuss PET scan results.  Dr. Valeta Harms, patient, and his family have discussed at last visit that if PET scan did show lung cancer they did not want to seek treatment for this.  We discussed today that PET scan results are favorable for malignancy.  The PET scan also showed a large right hydropneumothorax.  Patient states that he has not had any difficulty breathing and no pain on his right side.  States that he is at his baseline at this time.  He does have a son that lives with him and helps provide care for him.  We did discuss hospice versus outpatient palliative care.  Discussed the PET scan with Dr. Valeta Harms and we do believe the patient would be a good candidate for early initiation of home palliative care but at this time family refuses.  They would be open to hospice or palliative care if patient's health starts to decline.   Observations/Objective: PET scan 12/29/18 - The  cavitary right lower lobe nodule has maximum SUV of 6.2, favoring malignancy. There is some subtle accentuated activity at the right hilum, maximum SUV 3.7, suspicious for a small but mildly hypermetabolic lymph node. Right hydropneumothorax with large amount of pleural fluid and small pneumothorax component. Both are new compared to 08/31/2018. Other imaging findings of potential clinical significance: Aortic Atherosclerosis (ICD10-I70.0). Coronary atherosclerosis. Mild prostatomegaly. Lower lumbar spondylosis. Calcified bilateral pleural plaques suggesting remote asbestos exposure.  Assessment and Plan: Lung nodule.Hydropneumothroax seen on PET scan: We discussed today that PET scan results are favorable for malignancy.  The PET scan also showed a large right hydropneumothorax.  Patient states that he has not had any difficulty breathing and no pain on his right side.  States that he is at his baseline at this time.  He does have a son that lives with him and helps provide care for him.  We did discuss hospice versus outpatient palliative care.  Discussed the PET scan with Dr. Valeta Harms and we do believe the patient would be a good candidate for early initiation of home palliative care but at this time family refuses.  They would be open to hospice or palliative care if patient's health starts to decline.  Plan: Patient Instructions  Discussed PET scan with patient and family Please call if patient's condition changes. At this time patient is stable. Patient has good family support  Follow Up Instructions: Follow up with Dr. Valeta Harms in 1 month or sooner if needed  I discussed the assessment and treatment plan with the patient. The patient was provided an opportunity to ask questions and all were answered. The patient agreed with the plan and demonstrated an understanding of the instructions.   The patient was advised to call back or seek an in-person evaluation if the symptoms worsen or if the  condition fails to improve as anticipated.  I provided 22 minutes of non-face-to-face time during this encounter.   Fenton Foy, NP

## 2019-01-05 NOTE — Progress Notes (Signed)
PCCM: agree with hospice referral when family and patient are ready. Garner Nash, DO [facility] 01/05/2019 5:01 PM

## 2019-02-01 ENCOUNTER — Encounter: Payer: Self-pay | Admitting: Pulmonary Disease

## 2019-02-01 ENCOUNTER — Other Ambulatory Visit: Payer: Self-pay

## 2019-02-01 ENCOUNTER — Ambulatory Visit: Payer: Medicare Other | Admitting: Pulmonary Disease

## 2019-02-01 ENCOUNTER — Ambulatory Visit (INDEPENDENT_AMBULATORY_CARE_PROVIDER_SITE_OTHER): Payer: Medicare Other | Admitting: Pulmonary Disease

## 2019-02-01 VITALS — BP 128/70 | HR 73 | Ht 67.0 in | Wt 147.2 lb

## 2019-02-01 DIAGNOSIS — J984 Other disorders of lung: Secondary | ICD-10-CM | POA: Diagnosis not present

## 2019-02-01 DIAGNOSIS — J90 Pleural effusion, not elsewhere classified: Secondary | ICD-10-CM

## 2019-02-01 DIAGNOSIS — Z87891 Personal history of nicotine dependence: Secondary | ICD-10-CM

## 2019-02-01 DIAGNOSIS — R942 Abnormal results of pulmonary function studies: Secondary | ICD-10-CM

## 2019-02-01 MED ORDER — MORPHINE SULFATE 20 MG/5ML PO SOLN: 2.5000 mg | ORAL | 0 refills | Status: DC | PRN

## 2019-02-01 NOTE — Progress Notes (Addendum)
Synopsis: Referred in March 2020 for right-sided lung nodule by Glenda Chroman, MD  Subjective:   PATIENT ID: Clifford [name] GENDER: male DOB: 1931/12/05, MRN: [id number]  Chief Complaint  Patient presents with  . Follow-up    Daughter reports he is having increased SOB. He report he is mostly SOB at night and he does report having intermittent chest pain. Daughter reports he clears his throat frequently.     This is a 85 year old gentleman with a past medical history of Parkinson's disease.  History of rheumatoid arthritis since the 1980s.  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  Presents today for evaluation of a right-sided lung nodule that was found on incidental CT imaging.  He was having right-sided pains within the chest and had imaging which revealed presence of the nodule.  He was referred here for further evaluation.  Patient denies fevers chills night sweats weight loss.  He has not had any hemoptysis.  He does however have rather advanced Parkinson's disease.  He ambulates with a cane.  He does have baseline slow speech and slow gait.  He has had discussions with family his daughter is present.  They understand that this could very well represent a lung cancer however they do not believe that they would ever want to seek treatments for this.  They are here today to discuss about additional steps on whether we should consider observation of the lesion.  OV 02/01/2019: Since he last seen in the office the patient has been progressively declining at home.  Daughter states that she feels like he has become more weak also getting around much slower.  He did have follow-up imaging last month which revealed progression of his presumed lung cancer in the right chest with development of a loculated hydropneumothorax.  At this point would be presumed malignant pleural effusion.  I explained the office  today that this is exactly how we would expect him to continue to decline and have progression of symptoms.  He is complaining of pains now at nighttime pains in his back and chest as well as difficulty sleeping.  He feels like he is smothering when he is laying flat.  At this point has been able to only rest sitting up in a chair at nighttime.  Today in the office we discussed the utility risk benefits and alternatives of bringing in hospice care.  At this point she does believe the family is [name] to have hospice come in to help care for him and we will help arrange this.   Past Medical History:  Diagnosis Date  . Anemia    mild   . Arthritis   . CAD (coronary artery disease)    Catheterization September 18, 2010, EF 60%, small vessel disease and nonobstructive disease elsewhere, medical therapy /      post mi in 1981 (treated in [place] by Dr.zarotti)/nuclear stress ...april 2011...no ischemia/recurrent chest discomfort  march 2011  . Deafness in right ear   . GERD (gastroesophageal reflux disease)   . Hypertension   . Macular degeneration   . Pain    Hips, back, neck  . Palpitation    holter moniter..may 2011...normal sinus rhythum PVC's  . Parkinson's disease ([place])   . Rheumatoid arthritis ([place])   . SOB (shortness of breath)   . Testicular cancer ([place])   . Unsteady gait  Family History  Problem Relation Age of Onset  . Heart disease Mother   . Lung disease Father        from coal mine exposure  . Colon cancer Neg Hx      Past Surgical History:  Procedure Laterality Date  . APPENDECTOMY    . CARDIAC CATHETERIZATION     X 2  . cataracts    . COLONOSCOPY     unknown? remote past  . ESOPHAGOGASTRODUODENOSCOPY  2015   Dr. Britta Mccreedy: decreased esophageal peristalsis, normal esophagus, duodenal lipoma  . hemmorroid surgery  1984  . Shoulder operation  1996  . testicular cancer      Social History   Socioeconomic History  . Marital status: Widowed    Spouse name:  Not on file  . Number of children: 4  . Years of education: 4th  . Highest education level: Not on file  Occupational History  . Occupation: Theme park manager    Comment: retired Chemical engineer  Social Needs  . Financial resource strain: Not on file  . Food insecurity    Worry: Not on file    Inability: Not on file  . Transportation needs    Medical: Not on file    Non-medical: Not on file  Tobacco Use  . Smoking status: Former Smoker    Packs/day: 1.00    Years: 20.00    Pack years: 20.00    Quit date: 03/28/1962    Years since quitting: 56.8  . Smokeless tobacco: Never Used  Substance and Sexual Activity  . Alcohol use: No  . Drug use: No  . Sexual activity: Not on file  Lifestyle  . Physical activity    Days per week: Not on file    Minutes per session: Not on file  . Stress: Not on file  Relationships  . Social Herbalist on phone: Not on file    Gets together: Not on file    Attends religious service: Not on file    Active member of club or organization: Not on file    Attends meetings of clubs or organizations: Not on file    Relationship status: Not on file  . Intimate partner violence    Fear of current or ex partner: Not on file    Emotionally abused: Not on file    Physically abused: Not on file    Forced sexual activity: Not on file  Other Topics Concern  . Not on file  Social History Narrative   Lives at home with his son.   Right-handed.   3 cups coffee daily.     No Known Allergies   Outpatient Medications Prior to Visit  Medication Sig Dispense Refill  . carbidopa-levodopa (SINEMET IR) 25-100 MG tablet Take 1 tablet by mouth 3 (three) times daily. 90 tablet 6  . Cholecalciferol (VITAMIN D-3) 1000 units CAPS Take by mouth daily.    . Cyanocobalamin (VITAMIN B-12 PO) Take by mouth daily.    . Multiple Vitamins-Minerals (PRESERVISION AREDS PO) Take by mouth.    Marland Kitchen NAPROXEN PO Take by mouth as needed.    . pantoprazole (PROTONIX) 40 MG tablet  Take 40 mg by mouth daily.  12  . Polysacchar Iron-FA-B12 (FERREX 150 FORTE) 150-1-25 MG-MG-MCG CAPS Take 1 capsule by mouth daily. 30 capsule 5  . testosterone cypionate (DEPOTESTOSTERONE CYPIONATE) 200 MG/ML injection Inject 1 mL into the muscle every 14 (fourteen) days.    . traMADol (ULTRAM) 50 MG tablet Take  1 tablet (50 mg total) by mouth every 6 (six) hours as needed. 30 tablet 1   No facility-administered medications prior to visit.     Review of Systems  Constitutional: Positive for malaise/fatigue and weight loss. Negative for chills and fever.  HENT: Positive for hearing loss. Negative for sore throat and tinnitus.   Eyes: Negative for blurred vision and double vision.  Respiratory: Positive for cough and shortness of breath. Negative for hemoptysis, sputum production, wheezing and stridor.   Cardiovascular: Positive for chest pain. Negative for palpitations, orthopnea, leg swelling and PND.  Gastrointestinal: Negative for abdominal pain, constipation, diarrhea, heartburn, nausea and vomiting.  Genitourinary: Negative for dysuria, hematuria and urgency.  Musculoskeletal: Negative for joint pain and myalgias.  Skin: Negative for itching and rash.  Neurological: Positive for tremors and weakness. Negative for dizziness, tingling and headaches.  Endo/Heme/Allergies: Negative for environmental allergies. Does not bruise/bleed easily.  Psychiatric/Behavioral: Negative for depression. The patient is not nervous/anxious and does not have insomnia.   All other systems reviewed and are negative.    Objective:  Physical Exam Vitals signs reviewed.  Constitutional:      General: He is not in acute distress.    Appearance: He is well-developed.     Comments: Muscle wasting, temporalis muscle wasting, cachexia  HENT:     Head: Normocephalic and atraumatic.     Comments: Temporalis muscle wasting Eyes:     General: No scleral icterus.    Conjunctiva/sclera: Conjunctivae normal.      Pupils: Pupils are equal, round, and reactive to light.  Neck:     Musculoskeletal: Neck supple.     Vascular: No JVD.     Trachea: No tracheal deviation.  Cardiovascular:     Rate and Rhythm: Normal rate and regular rhythm.     Heart sounds: Normal heart sounds. No murmur.  Pulmonary:     Effort: No tachypnea, accessory muscle usage or respiratory distress.     Breath sounds: No stridor. No wheezing, rhonchi or rales.     Comments: Diminished breath sounds in the right chest cavity, up three quarters of the way. Abdominal:     General: Bowel sounds are normal. There is no distension.     Palpations: Abdomen is soft.     Tenderness: There is no abdominal tenderness.  Musculoskeletal:        General: No tenderness.  Lymphadenopathy:     Cervical: No cervical adenopathy.  Skin:    General: Skin is warm and dry.     Capillary Refill: Capillary refill takes less than 2 seconds.     Findings: No rash.  Neurological:     Mental Status: He is alert and oriented to person, place, and time.  Psychiatric:        Behavior: Behavior normal.      Vitals:   02/01/19 1615  BP: 128/70  Pulse: 73  SpO2: 95%  Weight: 147 lb 3.2 oz (66.8 kg)  Height: 5\' 7"  (1.702 m)   95% on RA BMI Readings from Last 3 Encounters:  02/01/19 23.05 kg/m  09/28/18 25.25 kg/m  08/23/18 24.71 kg/m   Wt Readings from Last 3 Encounters:  02/01/19 147 lb 3.2 oz (66.8 kg)  09/28/18 161 lb 3.2 oz (73.1 kg)  08/23/18 157 lb 12.8 oz (71.6 kg)     CBC    Component Value Date/Time   WBC 6.2 01/15/2016 1612   RBC 4.17 01/15/2016 1612   HGB 9.8 (L) 01/15/2016 1612  HCT 32.3 (L) 01/15/2016 1612   PLT 230 01/15/2016 1612   MCV 78 (L) 01/15/2016 1612   MCH 23.5 (L) 01/15/2016 1612   MCHC 30.3 (L) 01/15/2016 1612   RDW 16.5 (H) 01/15/2016 1612     Chest Imaging: CT chest 08/31/2018: Right-sided 2 cm lung nodule with cavitation, thick-walled. Concerning for primary bronchogenic carcinoma. The  patient's images have been independently reviewed by me.    June 2020: Nuclear medicine pet imaging Loculated hydropneumothorax in the right side, PET avid cavitary lesion.  Pulmonary Functions Testing Results:NOne   FeNO: None   Pathology: None   Echocardiogram: None   Heart Catheterization: None     Assessment & Plan:     ICD-10-CM   1. Cavitary lesion of lung  J98.4 Amb Referral to Palliative Care  2. Abnormal PET scan of lung  R94.2 Amb Referral to Palliative Care  3. Former smoker  Z87.891   4. Pleural effusion  J90 Amb Referral to Palliative Care    Discussion:  This is a 85 year old gentleman with advanced Parkinson's disease, macular degeneration history of rheumatoid arthritis, right-sided cavitary lung nodule concerning for primary bronchogenic carcinoma that was first discovered several months ago.  Now for the past 6 months patient has been continuing to live out his life at home.  Recent imaging would suggest he has a loculated right-sided hydropneumothorax now with progression of disease within the chest.  Likely has an underlying now metastatic lung cancer.  He has not had biopsies for this.  At his last office visit we discussed risk benefits and alternatives of proceeding with advanced diagnostics.  At that point the family and the patient decided that they would prefer not to pursue any invasive diagnosis and understand that they would like to focus on quality of life rather than pursue treatments.  Therefore at this time with his progression of symptoms and disease I think it is appropriate to have hospice come and evaluate the patient.  I have also given them prescription for morphine 2.5 mg liquid to be used as needed for shortness of breath and pain.  Especially for him to use at nighttime to potentially help him sleep better without being in so much pain.  They have also agreed that he would prefer to be a DNR.  They would like to fill this paperwork out with  hospice.  I have placed a referral for this.  25 minutes of today's office with visit was spent discussing advanced care planning, CODE STATUS as well as risk benefits and alternatives of proceeding with outpatient hospice care.  I will be happy to service his hospice physician.  Greater than 50% of this patient's 40-minute office visit was spent face-to-face discussing above recommendations and treatment plan.   Current Outpatient Medications:  .  carbidopa-levodopa (SINEMET IR) 25-100 MG tablet, Take 1 tablet by mouth 3 (three) times daily., Disp: 90 tablet, Rfl: 6 .  Cholecalciferol (VITAMIN D-3) 1000 units CAPS, Take by mouth daily., Disp: , Rfl:  .  Cyanocobalamin (VITAMIN B-12 PO), Take by mouth daily., Disp: , Rfl:  .  Multiple Vitamins-Minerals (PRESERVISION AREDS PO), Take by mouth., Disp: , Rfl:  .  NAPROXEN PO, Take by mouth as needed., Disp: , Rfl:  .  pantoprazole (PROTONIX) 40 MG tablet, Take 40 mg by mouth daily., Disp: , Rfl: 12 .  Polysacchar Iron-FA-B12 (FERREX 150 FORTE) 150-1-25 MG-MG-MCG CAPS, Take 1 capsule by mouth daily., Disp: 30 capsule, Rfl: 5 .  testosterone cypionate (DEPOTESTOSTERONE  CYPIONATE) 200 MG/ML injection, Inject 1 mL into the muscle every 14 (fourteen) days., Disp: , Rfl:  .  traMADol (ULTRAM) 50 MG tablet, Take 1 tablet (50 mg total) by mouth every 6 (six) hours as needed., Disp: 30 tablet, Rfl: 1   Garner Nash, DO [facility] 02/01/2019 4:20 PM     Addendum:   Due to advanced Parkinson's disease, macular degeneration history of rheumatoid arthritis, right-sided cavitary lung nodule concerning for primary bronchogenic carcinoma, pleural effusion, patient requires frequent changes in body position and/or has an immediate need for a change in body position. The patient requires the head of the bed to be elevated more than 30 degrees most of the time due to risk of aspiration and worsening pulmonary disease. The patient requires  positioning of the body in ways not feasible with an ordinary bed in order to alleviate pain.   [facility] 02/08/2019 6:57 PM

## 2019-02-01 NOTE — Patient Instructions (Addendum)
Thank you for visiting Dr. Valeta Harms at [facility]. Today we recommend the following:  Orders Placed This Encounter  Procedures  . Amb Referral to Palliative Care  Return if symptoms worsen or fail to improve.  Please do not hesitate to call.    Morphine can be used as needed for pain. Ok to take before bed.     Please do your part to reduce the spread of COVID-19.

## 2019-02-06 ENCOUNTER — Telehealth: Payer: Self-pay | Admitting: Pulmonary Disease

## 2019-02-06 DIAGNOSIS — J90 Pleural effusion, not elsewhere classified: Secondary | ICD-10-CM

## 2019-02-06 NOTE — Telephone Encounter (Signed)
LMTCB. Referral has been placed for Hospice. They will be the ones to provide the hospital bed.

## 2019-02-07 NOTE — Telephone Encounter (Signed)
Spoke with pt's daughter, [name]. Advised her that hospice would need to provide the pt's hospital bed. She states that they have not heard anything about the referral to hospice. There is not an order in Epic for hospice. Per Dr. Juline Patch OV note, pt was to be referred to palliative care. Order has been placed for palliative care. The pt is still sleeping in a recliner at this time and the pt's daughter states that Dr. Valeta Harms did not want him sleeping in the recliner any longer. Order has been placed for a hospital bed. Nothing further was needed at this time.

## 2019-02-08 NOTE — Telephone Encounter (Signed)
PCCM: Done OV addendum complete Garner Nash, DO [facility] 02/08/2019 6:57 PM

## 2019-02-08 NOTE — Telephone Encounter (Signed)
Received paper work from [employer] stating they need the [place] notes to reflect the following statement in order for insurance to cover:  Due to (insert DX here) patient requires frequent changes in body position and/or has an immediate need for a change in body position. The patient requires the head of the bed to be elevated mor than 30 degrees most of the time due to (CHF, Chronic Pulmonary Disease, Problems with Aspiration). The patient requires positioning of the body in ways not feasible with an ordinary bed in order to alleviate pain.   Per paperwork we will also need to add the DX to the actual order sent to [employer]. Will add Dx after BI has reviewed.   Will route message to BI. Once OV has been updated. Will fax to [employer].

## 2019-02-09 ENCOUNTER — Ambulatory Visit (INDEPENDENT_AMBULATORY_CARE_PROVIDER_SITE_OTHER): Payer: Medicare Other | Admitting: Gastroenterology

## 2019-02-09 ENCOUNTER — Telehealth: Payer: Self-pay | Admitting: Pulmonary Disease

## 2019-02-09 ENCOUNTER — Other Ambulatory Visit: Payer: Self-pay

## 2019-02-09 ENCOUNTER — Encounter: Payer: Self-pay | Admitting: Gastroenterology

## 2019-02-09 VITALS — BP 150/76 | HR 67 | Temp 96.8°F | Ht 67.0 in | Wt 145.8 lb

## 2019-02-09 DIAGNOSIS — R259 Unspecified abnormal involuntary movements: Secondary | ICD-10-CM

## 2019-02-09 DIAGNOSIS — R1310 Dysphagia, unspecified: Secondary | ICD-10-CM | POA: Diagnosis not present

## 2019-02-09 DIAGNOSIS — K5900 Constipation, unspecified: Secondary | ICD-10-CM

## 2019-02-09 DIAGNOSIS — J90 Pleural effusion, not elsewhere classified: Secondary | ICD-10-CM

## 2019-02-09 NOTE — Telephone Encounter (Signed)
This is the response I received from [employer]:   Mariann Barter sent to [place], Kennon Portela, Rachell Cipro, [place]; Elon Alas        I apologize, I thought i had responded to the original message. Sorry   Previous Messages  ----- Message -----  From: Mariann Barter  Sent: 02/09/2019  3:09 PM EDT  To: Teryl Lucy, *  Subject: RE: Hospital Bed Order sent on 02/07/2019     [facility], yes we got the order 02-07-2019. However, I had messaged back that the order needs to be corrected to state semi electric hospital bed, and we need the below narrative in the notes. Please let me know when this has been completed and i will pull it and upload it to his account   "Due to (insert DX here) patient requires frequent changes in body position AND/OR has an immediate need for a change in body position" AND ONE (OR MORE) OF THE FOLLOWING:   . "The patient has a medical condition which requires positioning of the body in ways not feasible with an ordinary bed", or  . "The patient requires positioning of the body in ways not feasible with an ordinary bed in order to alleviate pain", or  . "The patient requires the head of the bed to be elevated more than 30 degrees most of the time due to congestive heart failure, chronic pulmonary disease, or problems with  aspiration"

## 2019-02-09 NOTE — Telephone Encounter (Signed)
OK- placed ANOTHER ORDER for semi electric hospital bed  Wyn Quaker to sign order  [facility] for daughter

## 2019-02-09 NOTE — Progress Notes (Signed)
Referring Provider: Glenda Chroman, MD Primary Care Physician:  Glenda Chroman, MD Primary GI: Dr. Gala Romney   Chief Complaint  Patient presents with  . Dysphagia    HPI:   Clifford [name] is a 85 y.o. male presenting today with a history of dysphagia, chronic anemia dating back to 2013 in epic.Hgbwas last 9.8 in July 2017,, microcytic pattern. Outside labs from Jan 2020 with Hgb 11.1, Hct 35.8, Creatinine 1.33, BUN 17, Tbili 0.3, ALT <5, AST 25.4, Alk Phos 66, albumin 4.4. EGD completed in July 2015 by Dr. Britta Mccreedy with  normal esophagus, duodenal lipoma. He noted improvement with dilatation in the past. BPE Feb 2020 with esophageal dysmotility and suspected small right lateral pharyngocele. MBSS with Speech thereafter with impression of mild oropharyngeal dysphagia, recommending regular textures with chopped meats into bite-sized pieces, small, cup sips of thin liquids and no straws, swallow two times for each bite/sip and clear throat periodically. Recommended SLP therapy with history of Parkinson's disease to focus on dysphagia and dysarthria.   Speech therapy as outpatient recommended but was held off per son's request due to COVID and patient doing better.   In interim from last appt, diagnosed with lung cancer and has been referred to Palliative care. Daughter present with him today. Drinking ensure. Trying to get a hospital bed at home. Notes pill dysphagia. Choked easily and liquid comes out of nose. Constipation intermittently. Doesn't eat when constipated. BM usually about once a week.    Taking iron.   Past Medical History:  Diagnosis Date  . Anemia    mild   . Arthritis   . CAD (coronary artery disease)    Catheterization September 18, 2010, EF 60%, small vessel disease and nonobstructive disease elsewhere, medical therapy /      post mi in 1981 (treated in [place] by Dr.zarotti)/nuclear stress ...april 2011...no ischemia/recurrent chest discomfort  march 2011  .  Deafness in right ear   . GERD (gastroesophageal reflux disease)   . Hypertension   . Macular degeneration   . Pain    Hips, back, neck  . Palpitation    holter moniter..may 2011...normal sinus rhythum PVC's  . Parkinson's disease ([name])   . Rheumatoid arthritis ([place])   . SOB (shortness of breath)   . Testicular cancer ([name])   . Unsteady gait     Past Surgical History:  Procedure Laterality Date  . APPENDECTOMY    . CARDIAC CATHETERIZATION     X 2  . cataracts    . COLONOSCOPY     unknown? remote past  . ESOPHAGOGASTRODUODENOSCOPY  2015   Dr. Britta Mccreedy: decreased esophageal peristalsis, normal esophagus, duodenal lipoma  . hemmorroid surgery  1984  . Shoulder operation  1996  . testicular cancer      Current Outpatient Medications  Medication Sig Dispense Refill  . carbidopa-levodopa (SINEMET IR) 25-100 MG tablet Take 1 tablet by mouth 3 (three) times daily. 90 tablet 6  . Cholecalciferol (VITAMIN D-3) 1000 units CAPS Take by mouth daily.    . Cyanocobalamin (VITAMIN B-12 PO) Take by mouth daily.    . Multiple Vitamins-Minerals (PRESERVISION AREDS PO) Take by mouth.    Marland Kitchen NAPROXEN PO Take by mouth as needed.    . pantoprazole (PROTONIX) 40 MG tablet Take 40 mg by mouth daily.  12  . Polysacchar Iron-FA-B12 (FERREX 150 FORTE) 150-1-25 MG-MG-MCG CAPS Take 1 capsule by mouth daily. 30 capsule 5  . testosterone cypionate (DEPOTESTOSTERONE CYPIONATE) 200 MG/ML  injection Inject 1 mL into the muscle every 14 (fourteen) days.    . traMADol (ULTRAM) 50 MG tablet Take 1 tablet (50 mg total) by mouth every 6 (six) hours as needed. 30 tablet 1   No current facility-administered medications for this visit.     Allergies as of 02/09/2019  . (No Known Allergies)    Family History  Problem Relation Age of Onset  . Heart disease Mother   . Lung disease Father        from coal mine exposure  . Colon cancer Neg Hx     Social History   Socioeconomic History  . Marital status:  Widowed    Spouse name: Not on file  . Number of children: 4  . Years of education: 4th  . Highest education level: Not on file  Occupational History  . Occupation: Theme park manager    Comment: retired Chemical engineer  Social Needs  . Financial resource strain: Not on file  . Food insecurity    Worry: Not on file    Inability: Not on file  . Transportation needs    Medical: Not on file    Non-medical: Not on file  Tobacco Use  . Smoking status: Former Smoker    Packs/day: 1.00    Years: 20.00    Pack years: 20.00    Quit date: 03/28/1962    Years since quitting: 56.9  . Smokeless tobacco: Never Used  Substance and Sexual Activity  . Alcohol use: No  . Drug use: No  . Sexual activity: Not on file  Lifestyle  . Physical activity    Days per week: Not on file    Minutes per session: Not on file  . Stress: Not on file  Relationships  . Social Herbalist on phone: Not on file    Gets together: Not on file    Attends religious service: Not on file    Active member of club or organization: Not on file    Attends meetings of clubs or organizations: Not on file    Relationship status: Not on file  Other Topics Concern  . Not on file  Social History Narrative   Lives at home with his son.   Right-handed.   3 cups coffee daily.    Review of Systems: As mentioned in HPI   Physical Exam: BP (!) 150/76   Pulse 67   Temp (!) 96.8 F (36 C) (Temporal)   Ht _0  (1.702 m)   Wt 145 lb 12.8 oz (66.1 kg)   BMI 22.84 kg/m  General:   Alert and oriented. No distress noted. Frail, chronically-ill appearing. Thin. Head:  Normocephalic and atraumatic. Eyes:  Conjuctiva clear without scleral icterus. Mouth:  Oral mucosa pink and moist.  Abdomen:  +BS, soft, non-tender and non-distended. Limited exam as patient is in wheelchair Msk: kyphosis  Neurologic:  Alert and  oriented x4

## 2019-02-09 NOTE — Telephone Encounter (Signed)
Sent [facility] CM to [facility] for an update.

## 2019-02-09 NOTE — Patient Instructions (Signed)
I will see about having home speech therapy to help with diet choices and swallowing.  For constipation: take 1 teaspoon of Benefiber each morning. You can increase if needed. If bloating, abdominal discomfort, then taper down/off. Take Miralax one capful with water each evening if no bowel movement that day.   Please call if you need further help with constipation, and we can titrate things over the phone!  I enjoyed seeing you again today! As you know, I value our relationship and want to provide genuine, compassionate, and quality care. I welcome your feedback. If you receive a survey regarding your visit,  I greatly appreciate you taking time to fill this out. See you next time!  Annitta Needs, PhD, ANP-BC [facility]

## 2019-02-09 NOTE — Telephone Encounter (Signed)
Forwarding to [facility] per protocol since order placed

## 2019-02-10 NOTE — Assessment & Plan Note (Addendum)
85 year old frail male recently diagnosed with lung cancer and recommending palliative care, returning in follow-up for dysphagia. Previously evaluated with BPE noting dysmotility, and MBSS with mild oropharyngeal dysphagia in setting of Parkinson's. EGD not recommended at this time as doubt would be beneficial and dysphagia is oropharyngeal. In light of palliative care, would maximize non-invasive methods for supportive care. Would benefit from further speech therapy but traveling is difficult. Will discuss with SLP any options for home assessments, video, etc. Will see as needed as overall prognosis is grim in light of multiple comorbidities and new cancer diagnosis.

## 2019-02-10 NOTE — Telephone Encounter (Signed)
Called and spoke with patients daughter updated on bed . She verbalized understanding nothing further needed at this time.

## 2019-02-10 NOTE — Assessment & Plan Note (Signed)
Stop iron. Add Benefiber daily. Miralax on any given day no BM. Call with report and will provide further recommendations if needed.

## 2019-02-14 NOTE — Progress Notes (Signed)
cc'ed to pcp °

## 2019-03-23 ENCOUNTER — Telehealth: Payer: Self-pay | Admitting: Internal Medicine

## 2019-03-23 NOTE — Telephone Encounter (Signed)
Pt saw AB on 7/30 and the daughter, Arbie Cookey, called today to follow up with the patient having speech therapy coming out to the home. She said they having heard from anyone. Please call [phone]

## 2019-03-27 NOTE — Telephone Encounter (Signed)
AB, I spoke with pt's daughter. I'm not sure why they didn't receive a call from [facility]. I called [facility] and found out that the name is under [facility] now. They don't ask for the old [facility] orders. They asked if I could fax the pts order, demographics and last ov note. The order, demographic and ov note was faxed to [phone] as directed. If pt's daughter doesn't receive a call, she was asked to call our office back this week.    FYI- pt's daughter said her father was DX with stage 4 lung cancer in 01/2019 and given 6 months to live. The pt isn't aware of that and they are going on with life as normal. Pt's daughter has also called hospice to come in.

## 2019-03-29 NOTE — Telephone Encounter (Signed)
Speech therapist from [facility] called office, she spoke to pt's daughter. Hospice has been consulted for pt. If hospice care is started he will not be able have home health also. Speech therapist will contact daughter later this week to see if hospice accepted him.

## 2019-03-29 NOTE — Telephone Encounter (Signed)
Noted  

## 2019-07-17 DIAGNOSIS — J90 Pleural effusion, not elsewhere classified: Secondary | ICD-10-CM | POA: Diagnosis not present

## 2019-07-17 DIAGNOSIS — R0602 Shortness of breath: Secondary | ICD-10-CM | POA: Diagnosis not present

## 2019-07-17 DIAGNOSIS — I2510 Atherosclerotic heart disease of native coronary artery without angina pectoris: Secondary | ICD-10-CM | POA: Diagnosis not present

## 2019-07-17 DIAGNOSIS — J449 Chronic obstructive pulmonary disease, unspecified: Secondary | ICD-10-CM | POA: Diagnosis not present

## 2019-07-17 DIAGNOSIS — R259 Unspecified abnormal involuntary movements: Secondary | ICD-10-CM | POA: Diagnosis not present

## 2019-08-17 DIAGNOSIS — R259 Unspecified abnormal involuntary movements: Secondary | ICD-10-CM | POA: Diagnosis not present

## 2019-08-17 DIAGNOSIS — J90 Pleural effusion, not elsewhere classified: Secondary | ICD-10-CM | POA: Diagnosis not present

## 2019-08-17 DIAGNOSIS — R0602 Shortness of breath: Secondary | ICD-10-CM | POA: Diagnosis not present

## 2019-08-17 DIAGNOSIS — I2510 Atherosclerotic heart disease of native coronary artery without angina pectoris: Secondary | ICD-10-CM | POA: Diagnosis not present

## 2019-08-17 DIAGNOSIS — J449 Chronic obstructive pulmonary disease, unspecified: Secondary | ICD-10-CM | POA: Diagnosis not present

## 2019-09-11 DEATH — deceased

## 2020-05-14 IMAGING — PT NUCLEAR MEDICINE PET IMAGE INITIAL (PI) SKULL BASE TO THIGH
1 of 7 series · 3 of 16 positions shown, 4 images · non-contrast
Comparison: Chest CT from 08/31/2018 and abdomen CT from 08/30/2015
COMPARISON: Chest CT from 08/31/2018 and abdomen CT from 08/30/2015

Addendum:
CLINICAL DATA: Initial treatment strategy for lung nodule.

EXAM:
NUCLEAR MEDICINE PET SKULL BASE TO THIGH
TECHNIQUE: 7.4 mCi F-18 FDG was injected intravenously. Full-ring PET imaging
was performed from the skull base to thigh after the radiotracer. CT
data was obtained and used for attenuation correction and anatomic
localization.
Fasting blood glucose: 89 mg/dl

[Series 4: ct sk_thigh 5.0 b31f · axial · 0.84mm/px · z∈[-935,-63]mm · 3 of 219 slices shown, 4 images]
[im 1/219  soft-tissue]
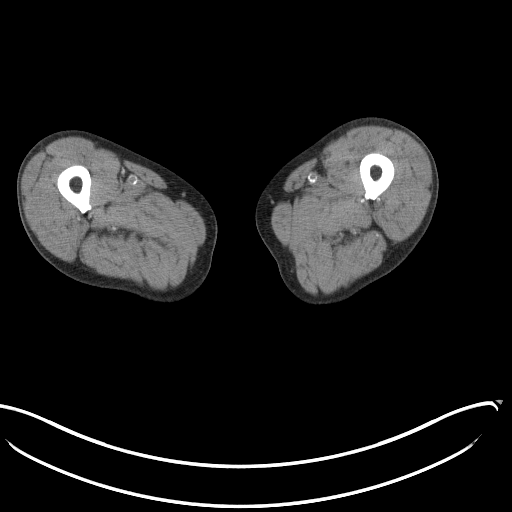
[im 1/219  bone]
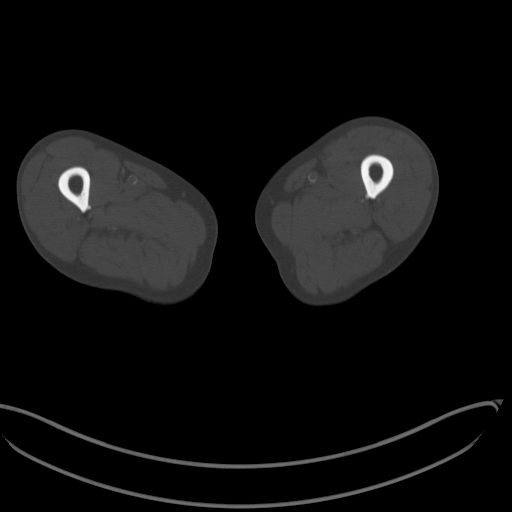
[im 110/219  soft-tissue]
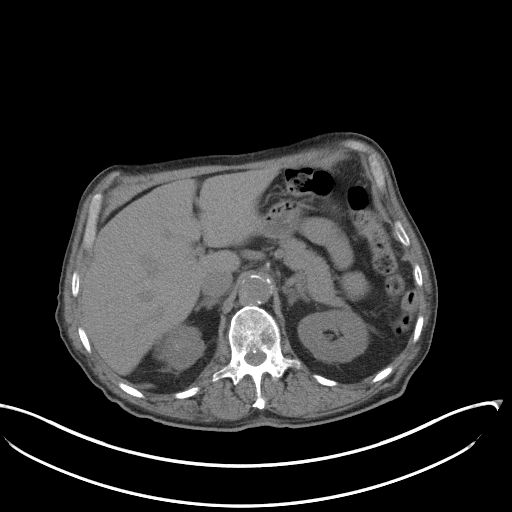
[im 219/219  soft-tissue]
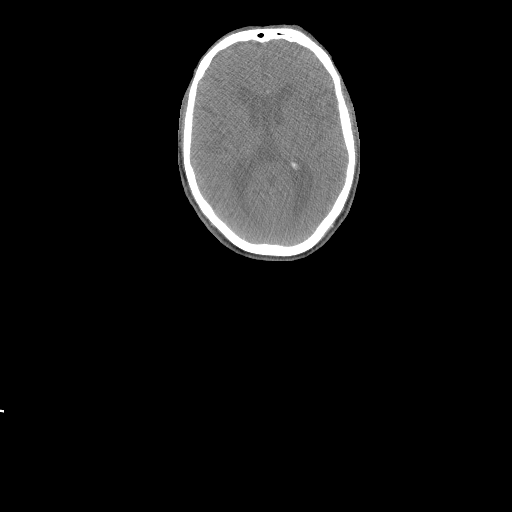

[3 of 16 positions shown; findings below may reference images not displayed]

FINDINGS: Mediastinal blood pool activity: SUV max

Liver activity: SUV max NA

NECK: No significant abnormal hypermetabolic activity in this
region.

Incidental CT findings: Bilateral common carotid atherosclerotic
calcification.

CHEST: Partially cavitary right lower lobe pulmonary nodule
measuring about 1.8 by 1.7 cm on image 85/4 has a maximum SUV of
6.2.

Subtle accentuated activity at the right hilum, maximum SUV 3.7.

Incidental CT findings: Right hydropneumothorax with large amount of
pleural fluid and a small pneumothorax component. Calcified pleural
plaques bilaterally.

Coronary, aortic arch, and branch vessel atherosclerotic vascular
disease.

ABDOMEN/PELVIS: Physiologic activity in the large bowel.

Incidental CT findings: Aortoiliac atherosclerotic vascular disease.
Right kidney upper pole photopenic lesion compatible with cyst. Mild
prostatomegaly.

SKELETON: Degenerative activity at the L4-5 level.

Incidental CT findings: Spondylosis noted especially in the lower
lumbar spine.
IMPRESSION: 1. The cavitary right lower lobe nodule has maximum SUV of 6.2,
favoring malignancy. There is some subtle accentuated activity at
the right hilum, maximum SUV 3.7, suspicious for a small but mildly
hypermetabolic lymph node.
2. Right hydropneumothorax with large amount of pleural fluid and
small pneumothorax component. Both are new compared to 08/31/2018.
3. Other imaging findings of potential clinical significance: Aortic
Atherosclerosis (3MG21-AKL.L). Coronary atherosclerosis. Mild
prostatomegaly. Lower lumbar spondylosis. Calcified bilateral
pleural plaques suggesting remote asbestos exposure.

Radiology assistant personnel have been notified to put me in
telephone contact with the referring physician or the referring
physician's clinical representative in order to discuss these
findings. Once this communication is established I will issue an
addendum to this report for documentation purposes.

ADDENDUM:
The original report was by Dr. Fallon Jim. The following
addendum is by Dr. Fallon Jim:

Critical Value/emergent results were called by telephone at the time
of interpretation on 12/29/2018 at [DATE] to Reenie Lop, PA, who
verbally acknowledged these results.

*** End of Addendum ***
FINDINGS: Mediastinal blood pool activity: SUV max

Liver activity: SUV max NA

NECK: No significant abnormal hypermetabolic activity in this
region.

Incidental CT findings: Bilateral common carotid atherosclerotic
calcification.

CHEST: Partially cavitary right lower lobe pulmonary nodule
measuring about 1.8 by 1.7 cm on image 85/4 has a maximum SUV of
6.2.

Subtle accentuated activity at the right hilum, maximum SUV 3.7.

Incidental CT findings: Right hydropneumothorax with large amount of
pleural fluid and a small pneumothorax component. Calcified pleural
plaques bilaterally.

Coronary, aortic arch, and branch vessel atherosclerotic vascular
disease.

ABDOMEN/PELVIS: Physiologic activity in the large bowel.

Incidental CT findings: Aortoiliac atherosclerotic vascular disease.
Right kidney upper pole photopenic lesion compatible with cyst. Mild
prostatomegaly.

SKELETON: Degenerative activity at the L4-5 level.

Incidental CT findings: Spondylosis noted especially in the lower
lumbar spine.
IMPRESSION: 1. The cavitary right lower lobe nodule has maximum SUV of 6.2,
favoring malignancy. There is some subtle accentuated activity at
the right hilum, maximum SUV 3.7, suspicious for a small but mildly
hypermetabolic lymph node.
2. Right hydropneumothorax with large amount of pleural fluid and
small pneumothorax component. Both are new compared to 08/31/2018.
3. Other imaging findings of potential clinical significance: Aortic
Atherosclerosis (3MG21-AKL.L). Coronary atherosclerosis. Mild
prostatomegaly. Lower lumbar spondylosis. Calcified bilateral
pleural plaques suggesting remote asbestos exposure.

Radiology assistant personnel have been notified to put me in
telephone contact with the referring physician or the referring
physician's clinical representative in order to discuss these
findings. Once this communication is established I will issue an
addendum to this report for documentation purposes.
